# Patient Record
Sex: Male | Born: 1945
Health system: Southern US, Community
[De-identification: ages and names within clinical notes are randomized; demographics above are authoritative.]

## PROBLEM LIST (undated history)

## (undated) DIAGNOSIS — I6930 Unspecified sequelae of cerebral infarction: Secondary | ICD-10-CM

## (undated) DIAGNOSIS — Z794 Long term (current) use of insulin: Secondary | ICD-10-CM

## (undated) DIAGNOSIS — E7800 Pure hypercholesterolemia, unspecified: Secondary | ICD-10-CM

## (undated) DIAGNOSIS — I110 Hypertensive heart disease with heart failure: Secondary | ICD-10-CM

## (undated) DIAGNOSIS — E1165 Type 2 diabetes mellitus with hyperglycemia: Secondary | ICD-10-CM

## (undated) DIAGNOSIS — I2510 Atherosclerotic heart disease of native coronary artery without angina pectoris: Secondary | ICD-10-CM

## (undated) DIAGNOSIS — I6381 Other cerebral infarction due to occlusion or stenosis of small artery: Secondary | ICD-10-CM

## (undated) DIAGNOSIS — I5022 Chronic systolic (congestive) heart failure: Secondary | ICD-10-CM

## (undated) HISTORY — DX: Other cerebral infarction due to occlusion or stenosis of small artery: I63.81

## (undated) HISTORY — PX: CORONARY ARTERY BYPASS GRAFT: SHX141

## (undated) HISTORY — DX: Unspecified sequelae of cerebral infarction: I69.30

## (undated) HISTORY — DX: Long term (current) use of insulin: Z79.4

## (undated) HISTORY — DX: Hypertensive heart disease with heart failure: I11.0

## (undated) HISTORY — DX: Pure hypercholesterolemia, unspecified: E78.00

## (undated) HISTORY — DX: Chronic systolic (congestive) heart failure: I50.22

## (undated) HISTORY — DX: Type 2 diabetes mellitus with hyperglycemia: E11.65

---

## 1995-02-06 HISTORY — PX: BACK SURGERY: SHX140

## 1998-02-05 HISTORY — PX: NECK SURGERY: SHX720

## 2007-08-11 ENCOUNTER — Ambulatory Visit: Payer: Self-pay | Admitting: Cardiothoracic Surgery

## 2014-06-03 DIAGNOSIS — I351 Nonrheumatic aortic (valve) insufficiency: Secondary | ICD-10-CM | POA: Diagnosis not present

## 2014-06-03 DIAGNOSIS — I63411 Cerebral infarction due to embolism of right middle cerebral artery: Secondary | ICD-10-CM | POA: Diagnosis present

## 2014-06-03 DIAGNOSIS — I361 Nonrheumatic tricuspid (valve) insufficiency: Secondary | ICD-10-CM | POA: Diagnosis not present

## 2014-06-03 DIAGNOSIS — Z951 Presence of aortocoronary bypass graft: Secondary | ICD-10-CM | POA: Diagnosis not present

## 2014-06-03 DIAGNOSIS — I340 Nonrheumatic mitral (valve) insufficiency: Secondary | ICD-10-CM | POA: Diagnosis present

## 2014-06-03 DIAGNOSIS — S199XXA Unspecified injury of neck, initial encounter: Secondary | ICD-10-CM | POA: Diagnosis not present

## 2014-06-03 DIAGNOSIS — E1165 Type 2 diabetes mellitus with hyperglycemia: Secondary | ICD-10-CM | POA: Diagnosis present

## 2014-06-03 DIAGNOSIS — I6789 Other cerebrovascular disease: Secondary | ICD-10-CM | POA: Diagnosis not present

## 2014-06-03 DIAGNOSIS — I255 Ischemic cardiomyopathy: Secondary | ICD-10-CM | POA: Diagnosis present

## 2014-06-03 DIAGNOSIS — F17210 Nicotine dependence, cigarettes, uncomplicated: Secondary | ICD-10-CM | POA: Diagnosis present

## 2014-06-03 DIAGNOSIS — H53462 Homonymous bilateral field defects, left side: Secondary | ICD-10-CM | POA: Diagnosis present

## 2014-06-03 DIAGNOSIS — S0990XA Unspecified injury of head, initial encounter: Secondary | ICD-10-CM | POA: Diagnosis not present

## 2014-06-03 DIAGNOSIS — I252 Old myocardial infarction: Secondary | ICD-10-CM | POA: Diagnosis not present

## 2014-06-03 DIAGNOSIS — F17211 Nicotine dependence, cigarettes, in remission: Secondary | ICD-10-CM | POA: Diagnosis not present

## 2014-06-03 DIAGNOSIS — R29810 Facial weakness: Secondary | ICD-10-CM | POA: Diagnosis not present

## 2014-06-03 DIAGNOSIS — I638 Other cerebral infarction: Secondary | ICD-10-CM | POA: Diagnosis not present

## 2014-06-03 DIAGNOSIS — R4182 Altered mental status, unspecified: Secondary | ICD-10-CM | POA: Diagnosis not present

## 2014-06-03 DIAGNOSIS — G8194 Hemiplegia, unspecified affecting left nondominant side: Secondary | ICD-10-CM | POA: Diagnosis present

## 2014-06-03 DIAGNOSIS — R531 Weakness: Secondary | ICD-10-CM | POA: Diagnosis not present

## 2014-06-03 DIAGNOSIS — R4781 Slurred speech: Secondary | ICD-10-CM | POA: Diagnosis not present

## 2014-06-03 DIAGNOSIS — Z7982 Long term (current) use of aspirin: Secondary | ICD-10-CM | POA: Diagnosis not present

## 2014-06-03 DIAGNOSIS — I63431 Cerebral infarction due to embolism of right posterior cerebral artery: Secondary | ICD-10-CM | POA: Diagnosis not present

## 2014-06-03 DIAGNOSIS — I480 Paroxysmal atrial fibrillation: Secondary | ICD-10-CM | POA: Diagnosis present

## 2014-06-03 DIAGNOSIS — I639 Cerebral infarction, unspecified: Secondary | ICD-10-CM | POA: Diagnosis not present

## 2014-06-03 DIAGNOSIS — E785 Hyperlipidemia, unspecified: Secondary | ICD-10-CM | POA: Diagnosis not present

## 2014-06-03 DIAGNOSIS — I63311 Cerebral infarction due to thrombosis of right middle cerebral artery: Secondary | ICD-10-CM | POA: Diagnosis not present

## 2014-06-03 DIAGNOSIS — I6350 Cerebral infarction due to unspecified occlusion or stenosis of unspecified cerebral artery: Secondary | ICD-10-CM | POA: Diagnosis not present

## 2014-06-03 DIAGNOSIS — I2510 Atherosclerotic heart disease of native coronary artery without angina pectoris: Secondary | ICD-10-CM | POA: Diagnosis not present

## 2014-06-03 DIAGNOSIS — S60512A Abrasion of left hand, initial encounter: Secondary | ICD-10-CM | POA: Diagnosis not present

## 2014-06-03 DIAGNOSIS — I63312 Cerebral infarction due to thrombosis of left middle cerebral artery: Secondary | ICD-10-CM | POA: Diagnosis not present

## 2014-06-03 DIAGNOSIS — R471 Dysarthria and anarthria: Secondary | ICD-10-CM | POA: Diagnosis present

## 2014-06-06 DIAGNOSIS — I639 Cerebral infarction, unspecified: Secondary | ICD-10-CM | POA: Diagnosis not present

## 2014-06-06 DIAGNOSIS — I255 Ischemic cardiomyopathy: Secondary | ICD-10-CM | POA: Diagnosis not present

## 2014-06-07 DIAGNOSIS — Z7901 Long term (current) use of anticoagulants: Secondary | ICD-10-CM | POA: Diagnosis not present

## 2014-06-07 DIAGNOSIS — I639 Cerebral infarction, unspecified: Secondary | ICD-10-CM | POA: Diagnosis not present

## 2014-06-07 DIAGNOSIS — Z5181 Encounter for therapeutic drug level monitoring: Secondary | ICD-10-CM | POA: Diagnosis not present

## 2014-06-11 DIAGNOSIS — I10 Essential (primary) hypertension: Secondary | ICD-10-CM | POA: Diagnosis not present

## 2014-06-11 DIAGNOSIS — Z7901 Long term (current) use of anticoagulants: Secondary | ICD-10-CM | POA: Diagnosis not present

## 2014-06-11 DIAGNOSIS — Z5181 Encounter for therapeutic drug level monitoring: Secondary | ICD-10-CM | POA: Diagnosis not present

## 2014-06-11 DIAGNOSIS — I6340 Cerebral infarction due to embolism of unspecified cerebral artery: Secondary | ICD-10-CM | POA: Diagnosis not present

## 2014-06-11 DIAGNOSIS — I639 Cerebral infarction, unspecified: Secondary | ICD-10-CM | POA: Diagnosis not present

## 2014-06-11 DIAGNOSIS — R791 Abnormal coagulation profile: Secondary | ICD-10-CM | POA: Diagnosis not present

## 2014-06-11 DIAGNOSIS — E119 Type 2 diabetes mellitus without complications: Secondary | ICD-10-CM | POA: Diagnosis not present

## 2014-06-17 DIAGNOSIS — Z5181 Encounter for therapeutic drug level monitoring: Secondary | ICD-10-CM | POA: Diagnosis not present

## 2014-06-17 DIAGNOSIS — I639 Cerebral infarction, unspecified: Secondary | ICD-10-CM | POA: Diagnosis not present

## 2014-06-17 DIAGNOSIS — Z7901 Long term (current) use of anticoagulants: Secondary | ICD-10-CM | POA: Diagnosis not present

## 2014-06-23 DIAGNOSIS — E118 Type 2 diabetes mellitus with unspecified complications: Secondary | ICD-10-CM | POA: Diagnosis not present

## 2014-06-23 DIAGNOSIS — I119 Hypertensive heart disease without heart failure: Secondary | ICD-10-CM | POA: Diagnosis not present

## 2014-06-23 DIAGNOSIS — E780 Pure hypercholesterolemia: Secondary | ICD-10-CM | POA: Diagnosis not present

## 2014-06-23 DIAGNOSIS — I639 Cerebral infarction, unspecified: Secondary | ICD-10-CM | POA: Diagnosis not present

## 2014-06-24 DIAGNOSIS — R791 Abnormal coagulation profile: Secondary | ICD-10-CM | POA: Diagnosis not present

## 2014-06-24 DIAGNOSIS — Z7901 Long term (current) use of anticoagulants: Secondary | ICD-10-CM | POA: Diagnosis not present

## 2014-06-24 DIAGNOSIS — I639 Cerebral infarction, unspecified: Secondary | ICD-10-CM | POA: Diagnosis not present

## 2014-06-24 DIAGNOSIS — E119 Type 2 diabetes mellitus without complications: Secondary | ICD-10-CM | POA: Diagnosis not present

## 2014-06-24 DIAGNOSIS — Z5181 Encounter for therapeutic drug level monitoring: Secondary | ICD-10-CM | POA: Diagnosis not present

## 2014-06-25 DIAGNOSIS — R002 Palpitations: Secondary | ICD-10-CM | POA: Diagnosis not present

## 2014-06-26 DIAGNOSIS — R002 Palpitations: Secondary | ICD-10-CM | POA: Diagnosis not present

## 2014-06-26 DIAGNOSIS — I491 Atrial premature depolarization: Secondary | ICD-10-CM | POA: Diagnosis not present

## 2014-06-26 DIAGNOSIS — I493 Ventricular premature depolarization: Secondary | ICD-10-CM | POA: Diagnosis not present

## 2014-07-06 DIAGNOSIS — Z7901 Long term (current) use of anticoagulants: Secondary | ICD-10-CM | POA: Diagnosis not present

## 2014-07-12 DIAGNOSIS — I255 Ischemic cardiomyopathy: Secondary | ICD-10-CM | POA: Diagnosis not present

## 2014-07-12 DIAGNOSIS — I6310 Cerebral infarction due to embolism of unspecified precerebral artery: Secondary | ICD-10-CM | POA: Diagnosis not present

## 2014-07-12 DIAGNOSIS — Z7901 Long term (current) use of anticoagulants: Secondary | ICD-10-CM | POA: Diagnosis not present

## 2014-07-12 DIAGNOSIS — I2510 Atherosclerotic heart disease of native coronary artery without angina pectoris: Secondary | ICD-10-CM | POA: Diagnosis not present

## 2014-07-12 DIAGNOSIS — E780 Pure hypercholesterolemia: Secondary | ICD-10-CM | POA: Diagnosis not present

## 2014-07-12 DIAGNOSIS — E119 Type 2 diabetes mellitus without complications: Secondary | ICD-10-CM | POA: Diagnosis not present

## 2014-07-19 DIAGNOSIS — Z7901 Long term (current) use of anticoagulants: Secondary | ICD-10-CM | POA: Diagnosis not present

## 2014-07-23 DIAGNOSIS — I5022 Chronic systolic (congestive) heart failure: Secondary | ICD-10-CM | POA: Diagnosis not present

## 2014-07-23 DIAGNOSIS — E1165 Type 2 diabetes mellitus with hyperglycemia: Secondary | ICD-10-CM | POA: Diagnosis not present

## 2014-07-23 DIAGNOSIS — I6930 Unspecified sequelae of cerebral infarction: Secondary | ICD-10-CM | POA: Diagnosis not present

## 2014-07-23 DIAGNOSIS — Z794 Long term (current) use of insulin: Secondary | ICD-10-CM | POA: Diagnosis not present

## 2014-07-26 DIAGNOSIS — E780 Pure hypercholesterolemia: Secondary | ICD-10-CM | POA: Diagnosis not present

## 2014-07-26 DIAGNOSIS — Z79899 Other long term (current) drug therapy: Secondary | ICD-10-CM | POA: Diagnosis not present

## 2014-07-26 DIAGNOSIS — Z7901 Long term (current) use of anticoagulants: Secondary | ICD-10-CM | POA: Diagnosis not present

## 2014-07-30 DIAGNOSIS — Z7901 Long term (current) use of anticoagulants: Secondary | ICD-10-CM | POA: Diagnosis not present

## 2014-08-13 DIAGNOSIS — Z7901 Long term (current) use of anticoagulants: Secondary | ICD-10-CM | POA: Diagnosis not present

## 2014-08-20 DIAGNOSIS — Z794 Long term (current) use of insulin: Secondary | ICD-10-CM | POA: Diagnosis not present

## 2014-08-20 DIAGNOSIS — I6930 Unspecified sequelae of cerebral infarction: Secondary | ICD-10-CM | POA: Diagnosis not present

## 2014-08-20 DIAGNOSIS — E1165 Type 2 diabetes mellitus with hyperglycemia: Secondary | ICD-10-CM | POA: Diagnosis not present

## 2014-08-20 DIAGNOSIS — I5022 Chronic systolic (congestive) heart failure: Secondary | ICD-10-CM | POA: Diagnosis not present

## 2014-08-20 DIAGNOSIS — Z0000 Encounter for general adult medical examination without abnormal findings: Secondary | ICD-10-CM | POA: Diagnosis not present

## 2014-08-20 DIAGNOSIS — Z7901 Long term (current) use of anticoagulants: Secondary | ICD-10-CM | POA: Diagnosis not present

## 2014-08-20 DIAGNOSIS — E780 Pure hypercholesterolemia: Secondary | ICD-10-CM | POA: Diagnosis not present

## 2014-08-23 DIAGNOSIS — E119 Type 2 diabetes mellitus without complications: Secondary | ICD-10-CM | POA: Diagnosis not present

## 2014-08-23 DIAGNOSIS — E780 Pure hypercholesterolemia: Secondary | ICD-10-CM | POA: Diagnosis not present

## 2014-08-23 DIAGNOSIS — I10 Essential (primary) hypertension: Secondary | ICD-10-CM | POA: Diagnosis not present

## 2014-08-23 DIAGNOSIS — I639 Cerebral infarction, unspecified: Secondary | ICD-10-CM | POA: Diagnosis not present

## 2014-09-22 DIAGNOSIS — Z7901 Long term (current) use of anticoagulants: Secondary | ICD-10-CM | POA: Diagnosis not present

## 2014-09-23 DIAGNOSIS — E780 Pure hypercholesterolemia: Secondary | ICD-10-CM | POA: Diagnosis not present

## 2014-09-23 DIAGNOSIS — I639 Cerebral infarction, unspecified: Secondary | ICD-10-CM | POA: Diagnosis not present

## 2014-09-23 DIAGNOSIS — E119 Type 2 diabetes mellitus without complications: Secondary | ICD-10-CM | POA: Diagnosis not present

## 2014-09-23 DIAGNOSIS — I10 Essential (primary) hypertension: Secondary | ICD-10-CM | POA: Diagnosis not present

## 2014-10-06 DIAGNOSIS — Z7901 Long term (current) use of anticoagulants: Secondary | ICD-10-CM | POA: Diagnosis not present

## 2014-10-13 DIAGNOSIS — Z7901 Long term (current) use of anticoagulants: Secondary | ICD-10-CM | POA: Diagnosis not present

## 2014-11-12 DIAGNOSIS — Z7901 Long term (current) use of anticoagulants: Secondary | ICD-10-CM | POA: Diagnosis not present

## 2014-11-26 DIAGNOSIS — Z7901 Long term (current) use of anticoagulants: Secondary | ICD-10-CM | POA: Diagnosis not present

## 2014-12-20 DIAGNOSIS — Z7901 Long term (current) use of anticoagulants: Secondary | ICD-10-CM | POA: Diagnosis not present

## 2014-12-24 DIAGNOSIS — I509 Heart failure, unspecified: Secondary | ICD-10-CM | POA: Diagnosis not present

## 2014-12-24 DIAGNOSIS — I639 Cerebral infarction, unspecified: Secondary | ICD-10-CM | POA: Diagnosis not present

## 2014-12-24 DIAGNOSIS — E785 Hyperlipidemia, unspecified: Secondary | ICD-10-CM | POA: Diagnosis not present

## 2014-12-24 DIAGNOSIS — E119 Type 2 diabetes mellitus without complications: Secondary | ICD-10-CM | POA: Diagnosis not present

## 2015-01-03 DIAGNOSIS — Z7901 Long term (current) use of anticoagulants: Secondary | ICD-10-CM | POA: Diagnosis not present

## 2015-01-23 DIAGNOSIS — I509 Heart failure, unspecified: Secondary | ICD-10-CM | POA: Diagnosis not present

## 2015-01-23 DIAGNOSIS — E119 Type 2 diabetes mellitus without complications: Secondary | ICD-10-CM | POA: Diagnosis not present

## 2015-01-23 DIAGNOSIS — I639 Cerebral infarction, unspecified: Secondary | ICD-10-CM | POA: Diagnosis not present

## 2015-01-23 DIAGNOSIS — E785 Hyperlipidemia, unspecified: Secondary | ICD-10-CM | POA: Diagnosis not present

## 2015-02-04 DIAGNOSIS — Z7901 Long term (current) use of anticoagulants: Secondary | ICD-10-CM | POA: Diagnosis not present

## 2015-02-04 DIAGNOSIS — Z794 Long term (current) use of insulin: Secondary | ICD-10-CM | POA: Diagnosis not present

## 2015-02-04 DIAGNOSIS — E785 Hyperlipidemia, unspecified: Secondary | ICD-10-CM | POA: Diagnosis not present

## 2015-02-04 DIAGNOSIS — I5022 Chronic systolic (congestive) heart failure: Secondary | ICD-10-CM | POA: Diagnosis not present

## 2015-02-04 DIAGNOSIS — E1165 Type 2 diabetes mellitus with hyperglycemia: Secondary | ICD-10-CM | POA: Diagnosis not present

## 2015-07-20 DIAGNOSIS — Z7901 Long term (current) use of anticoagulants: Secondary | ICD-10-CM | POA: Diagnosis not present

## 2015-08-03 DIAGNOSIS — Z7901 Long term (current) use of anticoagulants: Secondary | ICD-10-CM | POA: Diagnosis not present

## 2015-08-10 DIAGNOSIS — Z7901 Long term (current) use of anticoagulants: Secondary | ICD-10-CM | POA: Diagnosis not present

## 2015-08-11 DIAGNOSIS — Z794 Long term (current) use of insulin: Secondary | ICD-10-CM | POA: Diagnosis not present

## 2015-08-11 DIAGNOSIS — I6930 Unspecified sequelae of cerebral infarction: Secondary | ICD-10-CM | POA: Diagnosis not present

## 2015-08-11 DIAGNOSIS — I5022 Chronic systolic (congestive) heart failure: Secondary | ICD-10-CM | POA: Diagnosis not present

## 2015-08-11 DIAGNOSIS — I110 Hypertensive heart disease with heart failure: Secondary | ICD-10-CM | POA: Diagnosis not present

## 2015-08-11 DIAGNOSIS — E1165 Type 2 diabetes mellitus with hyperglycemia: Secondary | ICD-10-CM | POA: Diagnosis not present

## 2015-09-07 DIAGNOSIS — Z7901 Long term (current) use of anticoagulants: Secondary | ICD-10-CM | POA: Diagnosis not present

## 2015-09-21 DIAGNOSIS — Z7901 Long term (current) use of anticoagulants: Secondary | ICD-10-CM | POA: Diagnosis not present

## 2015-09-28 DIAGNOSIS — Z7901 Long term (current) use of anticoagulants: Secondary | ICD-10-CM | POA: Diagnosis not present

## 2015-10-12 DIAGNOSIS — Z7901 Long term (current) use of anticoagulants: Secondary | ICD-10-CM | POA: Diagnosis not present

## 2015-10-19 DIAGNOSIS — Z7901 Long term (current) use of anticoagulants: Secondary | ICD-10-CM | POA: Diagnosis not present

## 2015-11-02 DIAGNOSIS — Z7901 Long term (current) use of anticoagulants: Secondary | ICD-10-CM | POA: Diagnosis not present

## 2015-11-16 DIAGNOSIS — Z7901 Long term (current) use of anticoagulants: Secondary | ICD-10-CM | POA: Diagnosis not present

## 2015-11-30 DIAGNOSIS — Z7901 Long term (current) use of anticoagulants: Secondary | ICD-10-CM | POA: Diagnosis not present

## 2015-12-28 DIAGNOSIS — Z7901 Long term (current) use of anticoagulants: Secondary | ICD-10-CM | POA: Diagnosis not present

## 2016-01-18 DIAGNOSIS — Z7901 Long term (current) use of anticoagulants: Secondary | ICD-10-CM | POA: Diagnosis not present

## 2016-02-01 DIAGNOSIS — Z7901 Long term (current) use of anticoagulants: Secondary | ICD-10-CM | POA: Diagnosis not present

## 2016-02-15 DIAGNOSIS — Z7901 Long term (current) use of anticoagulants: Secondary | ICD-10-CM | POA: Diagnosis not present

## 2016-03-05 DIAGNOSIS — N419 Inflammatory disease of prostate, unspecified: Secondary | ICD-10-CM | POA: Diagnosis not present

## 2016-03-05 DIAGNOSIS — R319 Hematuria, unspecified: Secondary | ICD-10-CM | POA: Diagnosis not present

## 2016-03-05 DIAGNOSIS — N3001 Acute cystitis with hematuria: Secondary | ICD-10-CM | POA: Diagnosis not present

## 2016-03-07 DIAGNOSIS — Z7901 Long term (current) use of anticoagulants: Secondary | ICD-10-CM | POA: Diagnosis not present

## 2016-03-14 DIAGNOSIS — Z7901 Long term (current) use of anticoagulants: Secondary | ICD-10-CM | POA: Diagnosis not present

## 2016-03-21 DIAGNOSIS — Z794 Long term (current) use of insulin: Secondary | ICD-10-CM | POA: Diagnosis not present

## 2016-03-21 DIAGNOSIS — N5089 Other specified disorders of the male genital organs: Secondary | ICD-10-CM | POA: Diagnosis not present

## 2016-03-21 DIAGNOSIS — R310 Gross hematuria: Secondary | ICD-10-CM | POA: Diagnosis not present

## 2016-03-21 DIAGNOSIS — E1165 Type 2 diabetes mellitus with hyperglycemia: Secondary | ICD-10-CM | POA: Diagnosis not present

## 2016-03-21 DIAGNOSIS — Z125 Encounter for screening for malignant neoplasm of prostate: Secondary | ICD-10-CM | POA: Diagnosis not present

## 2016-03-21 DIAGNOSIS — R0602 Shortness of breath: Secondary | ICD-10-CM | POA: Diagnosis not present

## 2016-03-26 DIAGNOSIS — N433 Hydrocele, unspecified: Secondary | ICD-10-CM | POA: Diagnosis not present

## 2016-03-26 DIAGNOSIS — N5089 Other specified disorders of the male genital organs: Secondary | ICD-10-CM | POA: Diagnosis not present

## 2016-04-03 DIAGNOSIS — R338 Other retention of urine: Secondary | ICD-10-CM | POA: Diagnosis not present

## 2016-04-03 DIAGNOSIS — E291 Testicular hypofunction: Secondary | ICD-10-CM | POA: Diagnosis not present

## 2016-04-03 DIAGNOSIS — N401 Enlarged prostate with lower urinary tract symptoms: Secondary | ICD-10-CM | POA: Diagnosis not present

## 2016-04-03 DIAGNOSIS — N433 Hydrocele, unspecified: Secondary | ICD-10-CM | POA: Diagnosis not present

## 2016-04-05 DIAGNOSIS — J90 Pleural effusion, not elsewhere classified: Secondary | ICD-10-CM | POA: Diagnosis not present

## 2016-04-05 DIAGNOSIS — R109 Unspecified abdominal pain: Secondary | ICD-10-CM | POA: Diagnosis not present

## 2016-04-05 DIAGNOSIS — N3020 Other chronic cystitis without hematuria: Secondary | ICD-10-CM | POA: Diagnosis not present

## 2016-04-05 DIAGNOSIS — R188 Other ascites: Secondary | ICD-10-CM | POA: Diagnosis not present

## 2016-04-05 DIAGNOSIS — N318 Other neuromuscular dysfunction of bladder: Secondary | ICD-10-CM | POA: Diagnosis not present

## 2016-04-05 DIAGNOSIS — R3129 Other microscopic hematuria: Secondary | ICD-10-CM | POA: Diagnosis not present

## 2016-04-05 DIAGNOSIS — R311 Benign essential microscopic hematuria: Secondary | ICD-10-CM | POA: Diagnosis not present

## 2016-04-05 DIAGNOSIS — I517 Cardiomegaly: Secondary | ICD-10-CM | POA: Diagnosis not present

## 2016-04-05 DIAGNOSIS — I700 Atherosclerosis of aorta: Secondary | ICD-10-CM | POA: Diagnosis not present

## 2016-04-09 DIAGNOSIS — N318 Other neuromuscular dysfunction of bladder: Secondary | ICD-10-CM | POA: Diagnosis not present

## 2016-04-09 DIAGNOSIS — N401 Enlarged prostate with lower urinary tract symptoms: Secondary | ICD-10-CM | POA: Diagnosis not present

## 2016-04-09 DIAGNOSIS — R351 Nocturia: Secondary | ICD-10-CM | POA: Diagnosis not present

## 2016-04-10 DIAGNOSIS — J90 Pleural effusion, not elsewhere classified: Secondary | ICD-10-CM | POA: Diagnosis not present

## 2016-04-10 DIAGNOSIS — R188 Other ascites: Secondary | ICD-10-CM | POA: Diagnosis not present

## 2016-04-10 DIAGNOSIS — I700 Atherosclerosis of aorta: Secondary | ICD-10-CM | POA: Diagnosis not present

## 2016-04-11 ENCOUNTER — Ambulatory Visit (INDEPENDENT_AMBULATORY_CARE_PROVIDER_SITE_OTHER): Payer: Medicare Other | Admitting: Pulmonary Disease

## 2016-04-11 ENCOUNTER — Encounter: Payer: Self-pay | Admitting: Pulmonary Disease

## 2016-04-11 ENCOUNTER — Ambulatory Visit (INDEPENDENT_AMBULATORY_CARE_PROVIDER_SITE_OTHER)
Admission: RE | Admit: 2016-04-11 | Discharge: 2016-04-11 | Disposition: A | Discharge status: Home / Self Care | Payer: Medicare Other | Source: Ambulatory Visit | Attending: Pulmonary Disease | Admitting: Pulmonary Disease

## 2016-04-11 ENCOUNTER — Encounter (INDEPENDENT_AMBULATORY_CARE_PROVIDER_SITE_OTHER): Payer: Self-pay

## 2016-04-11 DIAGNOSIS — I517 Cardiomegaly: Secondary | ICD-10-CM | POA: Diagnosis not present

## 2016-04-11 DIAGNOSIS — R911 Solitary pulmonary nodule: Secondary | ICD-10-CM

## 2016-04-11 DIAGNOSIS — J90 Pleural effusion, not elsewhere classified: Secondary | ICD-10-CM | POA: Diagnosis not present

## 2016-04-11 DIAGNOSIS — I255 Ischemic cardiomyopathy: Secondary | ICD-10-CM

## 2016-04-11 DIAGNOSIS — I350 Nonrheumatic aortic (valve) stenosis: Secondary | ICD-10-CM | POA: Diagnosis not present

## 2016-04-11 DIAGNOSIS — I351 Nonrheumatic aortic (valve) insufficiency: Secondary | ICD-10-CM | POA: Diagnosis not present

## 2016-04-11 DIAGNOSIS — I081 Rheumatic disorders of both mitral and tricuspid valves: Secondary | ICD-10-CM | POA: Diagnosis not present

## 2016-04-11 NOTE — Assessment & Plan Note (Addendum)
Three-month follow-up scan scheduled for June 2018 - due to his heavy history of smoking will need short-term follow-up This could be related to fluid -I reviewed the images it is difficult to differentiate fluid density from true nodule

## 2016-04-11 NOTE — Assessment & Plan Note (Signed)
Related to congestive heart failure Continue diuresis Chest x-ray today

## 2016-04-11 NOTE — Assessment & Plan Note (Signed)
He does have pansystolic murmur of mitral regurgitation. He had an echocardiogram done today that was ordered by his PCP. He certainly needs follow-up with cardiologist and perhaps referral to an advanced heart failure team. He needs continued diuresis as much as his renal function will tolerate

## 2016-04-11 NOTE — Patient Instructions (Signed)
Chest x-ray today Follow-up CT scan no contrast in 3 months  Stay on Lasix, may have to increase dose if swelling does not go down

## 2016-04-11 NOTE — Progress Notes (Addendum)
Subjective:    Patient ID: Johnny Durham, male    DOB: Nov 27, 1945, 71 y.o.   MRN: [id number]  HPI  71 year old heavy ex-smoker with ischemic cardiomyopathy presents for evaluation of pleural effusions and finding of nodule on his imaging studies. Echo in 2016 and showed an EF of 35%, he is known to have ischemic cardiomyopathy since 2009 after a myocardial infarction. He is a diabetic for more than 15 years and has proteinuria. He developed increased body swelling starting from his scrotum in January which was progressive he saw a urologist in [place]. His weight had increased from 157-185 pounds. He was finally placed on Lasix which has helped decrease some of his abdominal swelling but swelling in his feet persists.   CT abdomen 03/2016 showed diffuse body wall edema with small volume of ascites and bilateral pleural effusions. CT chest with contrast 04/10/16 did not show any evidence of pulmonary embolism, confirmed bilateral effusions right more than left and showed a 11 mm nodule in the left lower lobe. I have reviewed this images and it is difficult to differentiate from fluid  He states that his dyspnea is considerably improved. He denies wheezing, coughing or chest pains. He denies frequent chest colds.  Spirometry today shows moderate to severe restriction, surprisingly no airway obstruction. He is a retired Administrator and smoked about 2-3 packs per day at his maximum and quit in 2016 when he was smoking about 1 pack per day- more than 50 pack years. He admits to continuing to vape now  Reviewed labs sent in from PCP including CBC and see med which appears normal, albumin was 4.3 and urinalysis showed severe proteinuria  Past Medical History:  Diagnosis Date  . Chronic systolic congestive heart failure, NYHA class 2 ([place])   . Hypercholesteremia   . Hypertensive heart disease with CHF ([place])   . Lacunar infarct, acute ([place])   . Sequela of cerebrovascular accident   . Uncontrolled  type 2 diabetes mellitus with hyperglycemia, with long-term current use of insulin [facility])      Past Surgical History:  Procedure Laterality Date  . BACK SURGERY  1997  . CORONARY ARTERY BYPASS GRAFT    . NECK SURGERY  2000     No Known Allergies   Social History   Social History  . Marital status: Married    Spouse name: N/A  . Number of children: N/A  . Years of education: N/A   Occupational History  . Not on file.   Social History Main Topics  . Smoking status: Former Research scientist (life sciences)  . Smokeless tobacco: Never Used  . Alcohol use No  . Drug use: No  . Sexual activity: Not on file   Other Topics Concern  . Not on file   Social History Narrative  . No narrative on file     Family History  Problem Relation Age of Onset  . Stomach cancer Mother   . Diabetes Mother   . Hypertension Mother   . Stomach cancer Father   . CVA Sister   . Colon polyps Brother   . Diabetes Brother   . Hypertension Brother   . Heart attack Paternal Grandmother   . Hypercholesterolemia Paternal Grandmother   . Heart attack Paternal Grandfather   . Hypercholesterolemia Paternal Grandfather   . Heart attack Paternal Uncle   . Hypercholesterolemia Paternal Uncle   . Diabetes Paternal Aunt     Review of Systems   Positive for shortness of breath with activity,  swelling all over the body is patient in the feet and increase in weight by about 20 pounds  Constitutional: negative for anorexia, fevers and sweats  Eyes: negative for irritation, redness and visual disturbance  Ears, nose, mouth, throat, and face: negative for earaches, epistaxis, nasal congestion and sore throat  Respiratory: negative for cough, sputum and wheezing  Cardiovascular: negative for chest pain, dyspnea,  orthopnea, palpitations and syncope  Gastrointestinal: negative for abdominal pain, constipation, diarrhea, melena, nausea and vomiting  Genitourinary:negative for dysuria, frequency and hematuria    Hematologic/lymphatic: negative for bleeding, easy bruising and lymphadenopathy  Musculoskeletal:negative for arthralgias, muscle weakness and stiff joints  Neurological: negative for coordination problems, gait problems, headaches and weakness  Endocrine: negative for diabetic symptoms including polydipsia, polyuria and weight loss     Objective:   Physical Exam  Gen. Pleasant, well-nourished, in no distress, normal affect ENT - no lesions, no post nasal drip Neck: No JVD, no thyromegaly, no carotid bruits Lungs: no use of accessory muscles, dullness to percussion Right base, Decreased breath sounds bilateral without rales or rhonchi  Cardiovascular: Rhythm regular, heart sounds  normal, pansystolic murmur at apex and left sternal border, no gallops, 2+ peripheral edema Abdomen: soft and non-tender, no hepatosplenomegaly, BS normal. Musculoskeletal: No deformities, no cyanosis or clubbing Neuro:  alert, non focal       Assessment & Plan:

## 2016-04-20 DIAGNOSIS — Z0000 Encounter for general adult medical examination without abnormal findings: Secondary | ICD-10-CM | POA: Diagnosis not present

## 2016-04-20 DIAGNOSIS — Z794 Long term (current) use of insulin: Secondary | ICD-10-CM | POA: Diagnosis not present

## 2016-04-20 DIAGNOSIS — E1165 Type 2 diabetes mellitus with hyperglycemia: Secondary | ICD-10-CM | POA: Diagnosis not present

## 2016-04-20 DIAGNOSIS — I482 Chronic atrial fibrillation: Secondary | ICD-10-CM | POA: Diagnosis not present

## 2016-04-20 DIAGNOSIS — Z7901 Long term (current) use of anticoagulants: Secondary | ICD-10-CM | POA: Diagnosis not present

## 2016-04-20 DIAGNOSIS — I110 Hypertensive heart disease with heart failure: Secondary | ICD-10-CM | POA: Diagnosis not present

## 2016-04-20 DIAGNOSIS — I5022 Chronic systolic (congestive) heart failure: Secondary | ICD-10-CM | POA: Diagnosis not present

## 2016-04-20 DIAGNOSIS — R0602 Shortness of breath: Secondary | ICD-10-CM | POA: Diagnosis not present

## 2016-04-27 DIAGNOSIS — Z5181 Encounter for therapeutic drug level monitoring: Secondary | ICD-10-CM | POA: Diagnosis not present

## 2016-04-27 DIAGNOSIS — Z7901 Long term (current) use of anticoagulants: Secondary | ICD-10-CM | POA: Diagnosis not present

## 2016-04-30 DIAGNOSIS — I200 Unstable angina: Secondary | ICD-10-CM | POA: Diagnosis not present

## 2016-04-30 DIAGNOSIS — I10 Essential (primary) hypertension: Secondary | ICD-10-CM | POA: Diagnosis not present

## 2016-04-30 DIAGNOSIS — I255 Ischemic cardiomyopathy: Secondary | ICD-10-CM | POA: Diagnosis not present

## 2016-04-30 DIAGNOSIS — E782 Mixed hyperlipidemia: Secondary | ICD-10-CM | POA: Diagnosis not present

## 2016-04-30 DIAGNOSIS — I2510 Atherosclerotic heart disease of native coronary artery without angina pectoris: Secondary | ICD-10-CM | POA: Diagnosis not present

## 2016-05-01 DIAGNOSIS — I2510 Atherosclerotic heart disease of native coronary artery without angina pectoris: Secondary | ICD-10-CM | POA: Diagnosis not present

## 2016-05-01 DIAGNOSIS — E782 Mixed hyperlipidemia: Secondary | ICD-10-CM | POA: Diagnosis not present

## 2016-05-01 DIAGNOSIS — I10 Essential (primary) hypertension: Secondary | ICD-10-CM | POA: Diagnosis not present

## 2016-05-01 DIAGNOSIS — I255 Ischemic cardiomyopathy: Secondary | ICD-10-CM | POA: Diagnosis not present

## 2016-05-03 DIAGNOSIS — Z7901 Long term (current) use of anticoagulants: Secondary | ICD-10-CM | POA: Diagnosis not present

## 2016-05-03 DIAGNOSIS — Z5181 Encounter for therapeutic drug level monitoring: Secondary | ICD-10-CM | POA: Diagnosis not present

## 2016-05-10 DIAGNOSIS — Z7901 Long term (current) use of anticoagulants: Secondary | ICD-10-CM | POA: Diagnosis not present

## 2016-05-10 DIAGNOSIS — Z5181 Encounter for therapeutic drug level monitoring: Secondary | ICD-10-CM | POA: Diagnosis not present

## 2016-05-24 DIAGNOSIS — Z7901 Long term (current) use of anticoagulants: Secondary | ICD-10-CM | POA: Diagnosis not present

## 2016-05-24 DIAGNOSIS — Z5181 Encounter for therapeutic drug level monitoring: Secondary | ICD-10-CM | POA: Diagnosis not present

## 2016-06-05 DIAGNOSIS — I2510 Atherosclerotic heart disease of native coronary artery without angina pectoris: Secondary | ICD-10-CM | POA: Diagnosis not present

## 2016-06-05 DIAGNOSIS — I10 Essential (primary) hypertension: Secondary | ICD-10-CM | POA: Diagnosis not present

## 2016-06-05 DIAGNOSIS — E782 Mixed hyperlipidemia: Secondary | ICD-10-CM | POA: Diagnosis not present

## 2016-06-19 ENCOUNTER — Telehealth: Payer: Self-pay

## 2016-06-19 DIAGNOSIS — E782 Mixed hyperlipidemia: Secondary | ICD-10-CM | POA: Diagnosis not present

## 2016-06-19 DIAGNOSIS — R911 Solitary pulmonary nodule: Secondary | ICD-10-CM

## 2016-06-19 DIAGNOSIS — I2510 Atherosclerotic heart disease of native coronary artery without angina pectoris: Secondary | ICD-10-CM | POA: Diagnosis not present

## 2016-06-19 DIAGNOSIS — I10 Essential (primary) hypertension: Secondary | ICD-10-CM | POA: Diagnosis not present

## 2016-06-19 DIAGNOSIS — I255 Ischemic cardiomyopathy: Secondary | ICD-10-CM | POA: Diagnosis not present

## 2016-06-19 NOTE — Telephone Encounter (Signed)
Pt wife return call to [place].Hillery Hunter

## 2016-06-19 NOTE — Telephone Encounter (Signed)
Called wife and left a message for her to call back to discuss CT per RA.

## 2016-06-19 NOTE — Telephone Encounter (Signed)
-----   Message from Rigoberto Noel, MD sent at 06/19/2016 10:52 AM EDT ----- Regarding: RE: follow up ov & June CT Verdene Creson,  Pl explain to him And documented phone noteThat   nodule was found on his prior CT scan and due to suspicion of cancer 3-6 month follow-up CT scan is advised   RA ----- Message ----- From: Ilona Sorrel Sent: 06/19/2016  10:27 AM To: Rigoberto Noel, MD, Valerie Salts, CMA Subject: follow up ov & June CT                         FYI - Order was put in on 04/11/16 for pt to have CT in June.  Follow up ov was sched for 6/8.  I made pt appt on 6/7 for CT.  I had left vm for pt to call me back for CT appt info & wife called this morning & stated he is doing much better, no fluid or cough.  She wanted to cancel ov & CT.  Stated they would call back if he has the need.     Judeen Hammans

## 2016-06-19 NOTE — Telephone Encounter (Signed)
Spoke with patient's wife regarding CT scan and OV. Pt's wife agreed that he needs the CT scan. Order will be placed again as it was already ordered and scheduled on 6/7.

## 2016-06-22 DIAGNOSIS — Z5181 Encounter for therapeutic drug level monitoring: Secondary | ICD-10-CM | POA: Diagnosis not present

## 2016-06-22 DIAGNOSIS — Z7901 Long term (current) use of anticoagulants: Secondary | ICD-10-CM | POA: Diagnosis not present

## 2016-06-22 DIAGNOSIS — E875 Hyperkalemia: Secondary | ICD-10-CM | POA: Diagnosis not present

## 2016-07-12 ENCOUNTER — Ambulatory Visit (INDEPENDENT_AMBULATORY_CARE_PROVIDER_SITE_OTHER)
Admission: RE | Admit: 2016-07-12 | Discharge: 2016-07-12 | Disposition: A | Discharge status: Home / Self Care | Payer: Medicare Other | Source: Ambulatory Visit | Attending: Pulmonary Disease | Admitting: Pulmonary Disease

## 2016-07-12 ENCOUNTER — Inpatient Hospital Stay: Admission: RE | Admit: 2016-07-12 | Payer: Medicare Other | Source: Ambulatory Visit

## 2016-07-12 DIAGNOSIS — R911 Solitary pulmonary nodule: Secondary | ICD-10-CM | POA: Diagnosis not present

## 2016-07-12 DIAGNOSIS — J90 Pleural effusion, not elsewhere classified: Secondary | ICD-10-CM | POA: Diagnosis not present

## 2016-07-12 DIAGNOSIS — R918 Other nonspecific abnormal finding of lung field: Secondary | ICD-10-CM | POA: Diagnosis not present

## 2016-07-13 ENCOUNTER — Ambulatory Visit: Payer: Medicare Other | Admitting: Pulmonary Disease

## 2016-07-13 ENCOUNTER — Telehealth: Payer: Self-pay | Admitting: Pulmonary Disease

## 2016-07-13 DIAGNOSIS — R911 Solitary pulmonary nodule: Secondary | ICD-10-CM

## 2016-07-13 NOTE — Telephone Encounter (Signed)
He still has fluid around right lung but this is decreased in size Nodule has gone away-was likely a combination of fluid and atelectasis  He still has lymphadenopathy but this is stable compared to prior CT from 04/2016  He will need to follow-up and need another follow-up scan in 6 months to assess lymphadenopathy

## 2016-07-13 NOTE — Telephone Encounter (Signed)
Called and spoke with the pt  He states needing his CT Chest results over the phone  He does not want to come in due to the far drive to get here, and he already cancelled his appt with TP for 6/11  Please advise, thanks!

## 2016-07-13 NOTE — Telephone Encounter (Signed)
Spoke with pt, aware of results/recs.  Order placed for follow up ct and recall placed for rov.  Nothing further needed.

## 2016-07-16 ENCOUNTER — Ambulatory Visit: Payer: Medicare Other | Admitting: Adult Health

## 2017-01-02 DIAGNOSIS — Z79899 Other long term (current) drug therapy: Secondary | ICD-10-CM | POA: Diagnosis not present

## 2017-01-02 DIAGNOSIS — Z7901 Long term (current) use of anticoagulants: Secondary | ICD-10-CM | POA: Diagnosis not present

## 2017-01-02 DIAGNOSIS — Z5181 Encounter for therapeutic drug level monitoring: Secondary | ICD-10-CM | POA: Diagnosis not present

## 2017-01-02 DIAGNOSIS — Z6824 Body mass index (BMI) 24.0-24.9, adult: Secondary | ICD-10-CM | POA: Diagnosis not present

## 2017-01-02 DIAGNOSIS — R600 Localized edema: Secondary | ICD-10-CM | POA: Diagnosis not present

## 2017-01-04 DIAGNOSIS — Z5181 Encounter for therapeutic drug level monitoring: Secondary | ICD-10-CM | POA: Diagnosis not present

## 2017-01-04 DIAGNOSIS — Z7901 Long term (current) use of anticoagulants: Secondary | ICD-10-CM | POA: Diagnosis not present

## 2017-01-08 DIAGNOSIS — Z5181 Encounter for therapeutic drug level monitoring: Secondary | ICD-10-CM | POA: Diagnosis not present

## 2017-01-08 DIAGNOSIS — I5022 Chronic systolic (congestive) heart failure: Secondary | ICD-10-CM | POA: Diagnosis not present

## 2017-01-08 DIAGNOSIS — Z7901 Long term (current) use of anticoagulants: Secondary | ICD-10-CM | POA: Diagnosis not present

## 2017-01-11 ENCOUNTER — Telehealth: Payer: Self-pay | Admitting: Pulmonary Disease

## 2017-01-11 NOTE — Telephone Encounter (Signed)
ok 

## 2017-01-11 NOTE — Telephone Encounter (Signed)
FYI for RA that the pt cancelled his CT scan appt.

## 2017-01-14 ENCOUNTER — Inpatient Hospital Stay: Admission: RE | Admit: 2017-01-14 | Payer: Medicare Other | Source: Ambulatory Visit

## 2017-01-15 DIAGNOSIS — I5022 Chronic systolic (congestive) heart failure: Secondary | ICD-10-CM | POA: Diagnosis not present

## 2017-01-15 DIAGNOSIS — Z5181 Encounter for therapeutic drug level monitoring: Secondary | ICD-10-CM | POA: Diagnosis not present

## 2017-01-15 DIAGNOSIS — Z7901 Long term (current) use of anticoagulants: Secondary | ICD-10-CM | POA: Diagnosis not present

## 2017-01-22 DIAGNOSIS — Z5181 Encounter for therapeutic drug level monitoring: Secondary | ICD-10-CM | POA: Diagnosis not present

## 2017-01-22 DIAGNOSIS — Z7901 Long term (current) use of anticoagulants: Secondary | ICD-10-CM | POA: Diagnosis not present

## 2017-01-31 DIAGNOSIS — Z951 Presence of aortocoronary bypass graft: Secondary | ICD-10-CM | POA: Diagnosis not present

## 2017-01-31 DIAGNOSIS — Z7901 Long term (current) use of anticoagulants: Secondary | ICD-10-CM | POA: Diagnosis not present

## 2017-01-31 DIAGNOSIS — I5020 Unspecified systolic (congestive) heart failure: Secondary | ICD-10-CM | POA: Diagnosis not present

## 2017-01-31 DIAGNOSIS — I110 Hypertensive heart disease with heart failure: Secondary | ICD-10-CM | POA: Diagnosis not present

## 2017-01-31 DIAGNOSIS — I350 Nonrheumatic aortic (valve) stenosis: Secondary | ICD-10-CM | POA: Diagnosis not present

## 2017-01-31 DIAGNOSIS — R0602 Shortness of breath: Secondary | ICD-10-CM | POA: Diagnosis not present

## 2017-01-31 DIAGNOSIS — I2510 Atherosclerotic heart disease of native coronary artery without angina pectoris: Secondary | ICD-10-CM | POA: Diagnosis not present

## 2017-01-31 DIAGNOSIS — I255 Ischemic cardiomyopathy: Secondary | ICD-10-CM | POA: Diagnosis not present

## 2017-01-31 DIAGNOSIS — R609 Edema, unspecified: Secondary | ICD-10-CM | POA: Diagnosis not present

## 2017-02-02 DIAGNOSIS — S7002XA Contusion of left hip, initial encounter: Secondary | ICD-10-CM | POA: Diagnosis not present

## 2017-02-02 DIAGNOSIS — S20219A Contusion of unspecified front wall of thorax, initial encounter: Secondary | ICD-10-CM | POA: Diagnosis not present

## 2017-02-07 DIAGNOSIS — Z5181 Encounter for therapeutic drug level monitoring: Secondary | ICD-10-CM | POA: Diagnosis not present

## 2017-02-07 DIAGNOSIS — Z7901 Long term (current) use of anticoagulants: Secondary | ICD-10-CM | POA: Diagnosis not present

## 2017-02-12 DIAGNOSIS — Z5181 Encounter for therapeutic drug level monitoring: Secondary | ICD-10-CM | POA: Diagnosis not present

## 2017-02-12 DIAGNOSIS — Z7901 Long term (current) use of anticoagulants: Secondary | ICD-10-CM | POA: Diagnosis not present

## 2017-03-01 DIAGNOSIS — Z5181 Encounter for therapeutic drug level monitoring: Secondary | ICD-10-CM | POA: Diagnosis not present

## 2017-03-01 DIAGNOSIS — Z7901 Long term (current) use of anticoagulants: Secondary | ICD-10-CM | POA: Diagnosis not present

## 2017-03-07 DIAGNOSIS — I2510 Atherosclerotic heart disease of native coronary artery without angina pectoris: Secondary | ICD-10-CM | POA: Diagnosis not present

## 2017-03-08 DIAGNOSIS — Z5181 Encounter for therapeutic drug level monitoring: Secondary | ICD-10-CM | POA: Diagnosis not present

## 2017-03-08 DIAGNOSIS — Z7901 Long term (current) use of anticoagulants: Secondary | ICD-10-CM | POA: Diagnosis not present

## 2017-03-13 DIAGNOSIS — E1121 Type 2 diabetes mellitus with diabetic nephropathy: Secondary | ICD-10-CM | POA: Diagnosis not present

## 2017-03-13 DIAGNOSIS — Z6824 Body mass index (BMI) 24.0-24.9, adult: Secondary | ICD-10-CM | POA: Diagnosis not present

## 2017-03-13 DIAGNOSIS — R945 Abnormal results of liver function studies: Secondary | ICD-10-CM | POA: Diagnosis not present

## 2017-03-13 DIAGNOSIS — R188 Other ascites: Secondary | ICD-10-CM | POA: Diagnosis not present

## 2017-03-13 DIAGNOSIS — Z7189 Other specified counseling: Secondary | ICD-10-CM | POA: Diagnosis not present

## 2017-03-14 DIAGNOSIS — R1907 Generalized intra-abdominal and pelvic swelling, mass and lump: Secondary | ICD-10-CM | POA: Diagnosis not present

## 2017-03-18 DIAGNOSIS — R188 Other ascites: Secondary | ICD-10-CM | POA: Diagnosis not present

## 2017-03-18 DIAGNOSIS — J90 Pleural effusion, not elsewhere classified: Secondary | ICD-10-CM | POA: Diagnosis not present

## 2017-03-18 DIAGNOSIS — K4020 Bilateral inguinal hernia, without obstruction or gangrene, not specified as recurrent: Secondary | ICD-10-CM | POA: Diagnosis not present

## 2017-03-18 DIAGNOSIS — K4090 Unilateral inguinal hernia, without obstruction or gangrene, not specified as recurrent: Secondary | ICD-10-CM | POA: Diagnosis not present

## 2017-03-18 DIAGNOSIS — R1907 Generalized intra-abdominal and pelvic swelling, mass and lump: Secondary | ICD-10-CM | POA: Diagnosis not present

## 2017-03-18 DIAGNOSIS — Z981 Arthrodesis status: Secondary | ICD-10-CM | POA: Diagnosis not present

## 2017-03-19 DIAGNOSIS — I2510 Atherosclerotic heart disease of native coronary artery without angina pectoris: Secondary | ICD-10-CM | POA: Diagnosis not present

## 2017-03-19 DIAGNOSIS — R609 Edema, unspecified: Secondary | ICD-10-CM | POA: Diagnosis not present

## 2017-03-19 DIAGNOSIS — I10 Essential (primary) hypertension: Secondary | ICD-10-CM | POA: Diagnosis not present

## 2017-03-19 DIAGNOSIS — Z951 Presence of aortocoronary bypass graft: Secondary | ICD-10-CM | POA: Diagnosis not present

## 2017-03-19 DIAGNOSIS — R601 Generalized edema: Secondary | ICD-10-CM | POA: Diagnosis not present

## 2017-03-19 DIAGNOSIS — E782 Mixed hyperlipidemia: Secondary | ICD-10-CM | POA: Diagnosis not present

## 2017-03-19 DIAGNOSIS — I482 Chronic atrial fibrillation: Secondary | ICD-10-CM | POA: Diagnosis not present

## 2017-03-19 DIAGNOSIS — I350 Nonrheumatic aortic (valve) stenosis: Secondary | ICD-10-CM | POA: Diagnosis not present

## 2017-03-19 DIAGNOSIS — R0602 Shortness of breath: Secondary | ICD-10-CM | POA: Diagnosis not present

## 2017-03-19 DIAGNOSIS — I714 Abdominal aortic aneurysm, without rupture: Secondary | ICD-10-CM | POA: Diagnosis not present

## 2017-03-19 DIAGNOSIS — I255 Ischemic cardiomyopathy: Secondary | ICD-10-CM | POA: Diagnosis not present

## 2017-03-29 DIAGNOSIS — I10 Essential (primary) hypertension: Secondary | ICD-10-CM | POA: Diagnosis not present

## 2017-03-29 DIAGNOSIS — R0602 Shortness of breath: Secondary | ICD-10-CM | POA: Diagnosis not present

## 2017-03-29 DIAGNOSIS — I2510 Atherosclerotic heart disease of native coronary artery without angina pectoris: Secondary | ICD-10-CM | POA: Diagnosis not present

## 2017-03-29 DIAGNOSIS — I350 Nonrheumatic aortic (valve) stenosis: Secondary | ICD-10-CM | POA: Diagnosis not present

## 2017-03-29 DIAGNOSIS — Z5181 Encounter for therapeutic drug level monitoring: Secondary | ICD-10-CM | POA: Diagnosis not present

## 2017-03-29 DIAGNOSIS — I714 Abdominal aortic aneurysm, without rupture: Secondary | ICD-10-CM | POA: Diagnosis not present

## 2017-03-29 DIAGNOSIS — I255 Ischemic cardiomyopathy: Secondary | ICD-10-CM | POA: Diagnosis not present

## 2017-03-29 DIAGNOSIS — R609 Edema, unspecified: Secondary | ICD-10-CM | POA: Diagnosis not present

## 2017-03-29 DIAGNOSIS — E782 Mixed hyperlipidemia: Secondary | ICD-10-CM | POA: Diagnosis not present

## 2017-03-29 DIAGNOSIS — Z7901 Long term (current) use of anticoagulants: Secondary | ICD-10-CM | POA: Diagnosis not present

## 2017-03-29 DIAGNOSIS — I482 Chronic atrial fibrillation: Secondary | ICD-10-CM | POA: Diagnosis not present

## 2017-04-01 DIAGNOSIS — I2510 Atherosclerotic heart disease of native coronary artery without angina pectoris: Secondary | ICD-10-CM | POA: Diagnosis not present

## 2017-04-01 DIAGNOSIS — I350 Nonrheumatic aortic (valve) stenosis: Secondary | ICD-10-CM | POA: Diagnosis not present

## 2017-04-01 DIAGNOSIS — I4891 Unspecified atrial fibrillation: Secondary | ICD-10-CM | POA: Diagnosis not present

## 2017-04-01 DIAGNOSIS — I5020 Unspecified systolic (congestive) heart failure: Secondary | ICD-10-CM | POA: Diagnosis not present

## 2017-04-01 DIAGNOSIS — Z951 Presence of aortocoronary bypass graft: Secondary | ICD-10-CM | POA: Diagnosis not present

## 2017-04-01 DIAGNOSIS — I482 Chronic atrial fibrillation: Secondary | ICD-10-CM | POA: Diagnosis not present

## 2017-04-01 DIAGNOSIS — I714 Abdominal aortic aneurysm, without rupture: Secondary | ICD-10-CM | POA: Diagnosis not present

## 2017-04-01 DIAGNOSIS — R601 Generalized edema: Secondary | ICD-10-CM | POA: Diagnosis not present

## 2017-04-01 DIAGNOSIS — I255 Ischemic cardiomyopathy: Secondary | ICD-10-CM | POA: Diagnosis not present

## 2017-04-01 DIAGNOSIS — I509 Heart failure, unspecified: Secondary | ICD-10-CM | POA: Diagnosis not present

## 2017-04-03 DIAGNOSIS — I4891 Unspecified atrial fibrillation: Secondary | ICD-10-CM | POA: Diagnosis not present

## 2017-04-05 DIAGNOSIS — J069 Acute upper respiratory infection, unspecified: Secondary | ICD-10-CM | POA: Diagnosis not present

## 2017-04-05 DIAGNOSIS — R319 Hematuria, unspecified: Secondary | ICD-10-CM | POA: Diagnosis not present

## 2017-04-05 DIAGNOSIS — N3001 Acute cystitis with hematuria: Secondary | ICD-10-CM | POA: Diagnosis not present

## 2017-04-05 DIAGNOSIS — I350 Nonrheumatic aortic (valve) stenosis: Secondary | ICD-10-CM | POA: Diagnosis not present

## 2017-04-15 DIAGNOSIS — I2510 Atherosclerotic heart disease of native coronary artery without angina pectoris: Secondary | ICD-10-CM | POA: Diagnosis not present

## 2017-04-18 DIAGNOSIS — I714 Abdominal aortic aneurysm, without rupture: Secondary | ICD-10-CM | POA: Diagnosis not present

## 2017-04-18 DIAGNOSIS — I350 Nonrheumatic aortic (valve) stenosis: Secondary | ICD-10-CM | POA: Diagnosis not present

## 2017-04-18 DIAGNOSIS — I509 Heart failure, unspecified: Secondary | ICD-10-CM | POA: Diagnosis not present

## 2017-04-18 DIAGNOSIS — I255 Ischemic cardiomyopathy: Secondary | ICD-10-CM | POA: Diagnosis not present

## 2017-04-18 DIAGNOSIS — Z951 Presence of aortocoronary bypass graft: Secondary | ICD-10-CM | POA: Diagnosis not present

## 2017-04-18 DIAGNOSIS — I501 Left ventricular failure: Secondary | ICD-10-CM | POA: Diagnosis not present

## 2017-04-18 DIAGNOSIS — I480 Paroxysmal atrial fibrillation: Secondary | ICD-10-CM | POA: Diagnosis not present

## 2017-04-18 DIAGNOSIS — I272 Pulmonary hypertension, unspecified: Secondary | ICD-10-CM | POA: Diagnosis not present

## 2017-04-18 DIAGNOSIS — E8770 Fluid overload, unspecified: Secondary | ICD-10-CM | POA: Diagnosis not present

## 2017-04-18 DIAGNOSIS — J9811 Atelectasis: Secondary | ICD-10-CM | POA: Diagnosis not present

## 2017-04-18 DIAGNOSIS — I2510 Atherosclerotic heart disease of native coronary artery without angina pectoris: Secondary | ICD-10-CM | POA: Diagnosis not present

## 2017-04-26 DIAGNOSIS — Z1331 Encounter for screening for depression: Secondary | ICD-10-CM | POA: Diagnosis not present

## 2017-04-26 DIAGNOSIS — Z6826 Body mass index (BMI) 26.0-26.9, adult: Secondary | ICD-10-CM | POA: Diagnosis not present

## 2017-04-26 DIAGNOSIS — I110 Hypertensive heart disease with heart failure: Secondary | ICD-10-CM | POA: Diagnosis not present

## 2017-04-26 DIAGNOSIS — I5022 Chronic systolic (congestive) heart failure: Secondary | ICD-10-CM | POA: Diagnosis not present

## 2017-04-29 DIAGNOSIS — I700 Atherosclerosis of aorta: Secondary | ICD-10-CM | POA: Diagnosis not present

## 2017-04-29 DIAGNOSIS — R188 Other ascites: Secondary | ICD-10-CM | POA: Diagnosis not present

## 2017-04-29 DIAGNOSIS — I714 Abdominal aortic aneurysm, without rupture: Secondary | ICD-10-CM | POA: Diagnosis not present

## 2017-04-29 DIAGNOSIS — R935 Abnormal findings on diagnostic imaging of other abdominal regions, including retroperitoneum: Secondary | ICD-10-CM | POA: Diagnosis not present

## 2017-05-07 DIAGNOSIS — K263 Acute duodenal ulcer without hemorrhage or perforation: Secondary | ICD-10-CM | POA: Diagnosis not present

## 2017-05-07 DIAGNOSIS — E119 Type 2 diabetes mellitus without complications: Secondary | ICD-10-CM | POA: Diagnosis not present

## 2017-05-07 DIAGNOSIS — K2971 Gastritis, unspecified, with bleeding: Secondary | ICD-10-CM | POA: Diagnosis not present

## 2017-05-07 DIAGNOSIS — I4891 Unspecified atrial fibrillation: Secondary | ICD-10-CM | POA: Diagnosis not present

## 2017-05-07 DIAGNOSIS — I509 Heart failure, unspecified: Secondary | ICD-10-CM | POA: Diagnosis not present

## 2017-05-07 DIAGNOSIS — R188 Other ascites: Secondary | ICD-10-CM | POA: Diagnosis not present

## 2017-05-07 DIAGNOSIS — K7460 Unspecified cirrhosis of liver: Secondary | ICD-10-CM | POA: Diagnosis not present

## 2017-05-07 DIAGNOSIS — D649 Anemia, unspecified: Secondary | ICD-10-CM | POA: Diagnosis not present

## 2017-05-07 DIAGNOSIS — I10 Essential (primary) hypertension: Secondary | ICD-10-CM | POA: Diagnosis not present

## 2017-05-10 DIAGNOSIS — R1907 Generalized intra-abdominal and pelvic swelling, mass and lump: Secondary | ICD-10-CM | POA: Diagnosis not present

## 2017-05-10 DIAGNOSIS — J90 Pleural effusion, not elsewhere classified: Secondary | ICD-10-CM | POA: Diagnosis not present

## 2017-05-10 DIAGNOSIS — K4020 Bilateral inguinal hernia, without obstruction or gangrene, not specified as recurrent: Secondary | ICD-10-CM | POA: Diagnosis not present

## 2017-05-10 DIAGNOSIS — R188 Other ascites: Secondary | ICD-10-CM | POA: Diagnosis not present

## 2017-05-10 DIAGNOSIS — Z981 Arthrodesis status: Secondary | ICD-10-CM | POA: Diagnosis not present

## 2017-05-10 DIAGNOSIS — Z79899 Other long term (current) drug therapy: Secondary | ICD-10-CM | POA: Diagnosis not present

## 2017-05-28 DIAGNOSIS — R188 Other ascites: Secondary | ICD-10-CM | POA: Diagnosis not present

## 2017-05-28 DIAGNOSIS — D500 Iron deficiency anemia secondary to blood loss (chronic): Secondary | ICD-10-CM | POA: Diagnosis not present

## 2017-06-18 DIAGNOSIS — Z7901 Long term (current) use of anticoagulants: Secondary | ICD-10-CM | POA: Diagnosis not present

## 2017-06-18 DIAGNOSIS — D500 Iron deficiency anemia secondary to blood loss (chronic): Secondary | ICD-10-CM | POA: Diagnosis not present

## 2017-06-19 DIAGNOSIS — K2950 Unspecified chronic gastritis without bleeding: Secondary | ICD-10-CM | POA: Diagnosis not present

## 2017-06-19 DIAGNOSIS — K2980 Duodenitis without bleeding: Secondary | ICD-10-CM | POA: Diagnosis not present

## 2017-06-19 DIAGNOSIS — D125 Benign neoplasm of sigmoid colon: Secondary | ICD-10-CM | POA: Diagnosis not present

## 2017-06-19 DIAGNOSIS — I8510 Secondary esophageal varices without bleeding: Secondary | ICD-10-CM | POA: Diagnosis not present

## 2017-06-19 DIAGNOSIS — D128 Benign neoplasm of rectum: Secondary | ICD-10-CM | POA: Diagnosis not present

## 2017-06-19 DIAGNOSIS — D122 Benign neoplasm of ascending colon: Secondary | ICD-10-CM | POA: Diagnosis not present

## 2017-06-19 DIAGNOSIS — K766 Portal hypertension: Secondary | ICD-10-CM | POA: Diagnosis not present

## 2017-06-19 DIAGNOSIS — D126 Benign neoplasm of colon, unspecified: Secondary | ICD-10-CM | POA: Diagnosis not present

## 2017-06-19 DIAGNOSIS — R195 Other fecal abnormalities: Secondary | ICD-10-CM | POA: Diagnosis not present

## 2017-06-19 DIAGNOSIS — R188 Other ascites: Secondary | ICD-10-CM | POA: Diagnosis not present

## 2017-06-19 DIAGNOSIS — D500 Iron deficiency anemia secondary to blood loss (chronic): Secondary | ICD-10-CM | POA: Diagnosis not present

## 2017-06-19 DIAGNOSIS — K635 Polyp of colon: Secondary | ICD-10-CM | POA: Diagnosis not present

## 2017-07-19 DIAGNOSIS — D500 Iron deficiency anemia secondary to blood loss (chronic): Secondary | ICD-10-CM | POA: Diagnosis not present

## 2017-07-23 DIAGNOSIS — R188 Other ascites: Secondary | ICD-10-CM | POA: Diagnosis not present

## 2017-07-23 DIAGNOSIS — D500 Iron deficiency anemia secondary to blood loss (chronic): Secondary | ICD-10-CM | POA: Diagnosis not present

## 2017-07-23 DIAGNOSIS — Z1212 Encounter for screening for malignant neoplasm of rectum: Secondary | ICD-10-CM | POA: Diagnosis not present

## 2017-07-29 DIAGNOSIS — D500 Iron deficiency anemia secondary to blood loss (chronic): Secondary | ICD-10-CM | POA: Diagnosis not present

## 2017-09-22 IMAGING — DX DG CHEST 2V
2 series · 2 of 2 positions shown · non-contrast
Comparison: None in PACs

CLINICAL DATA: Routine examination. History of CHF, cardiomegaly,
CABG, diabetes, current smoker.

EXAM:
CHEST  2 VIEW

[chest pa]
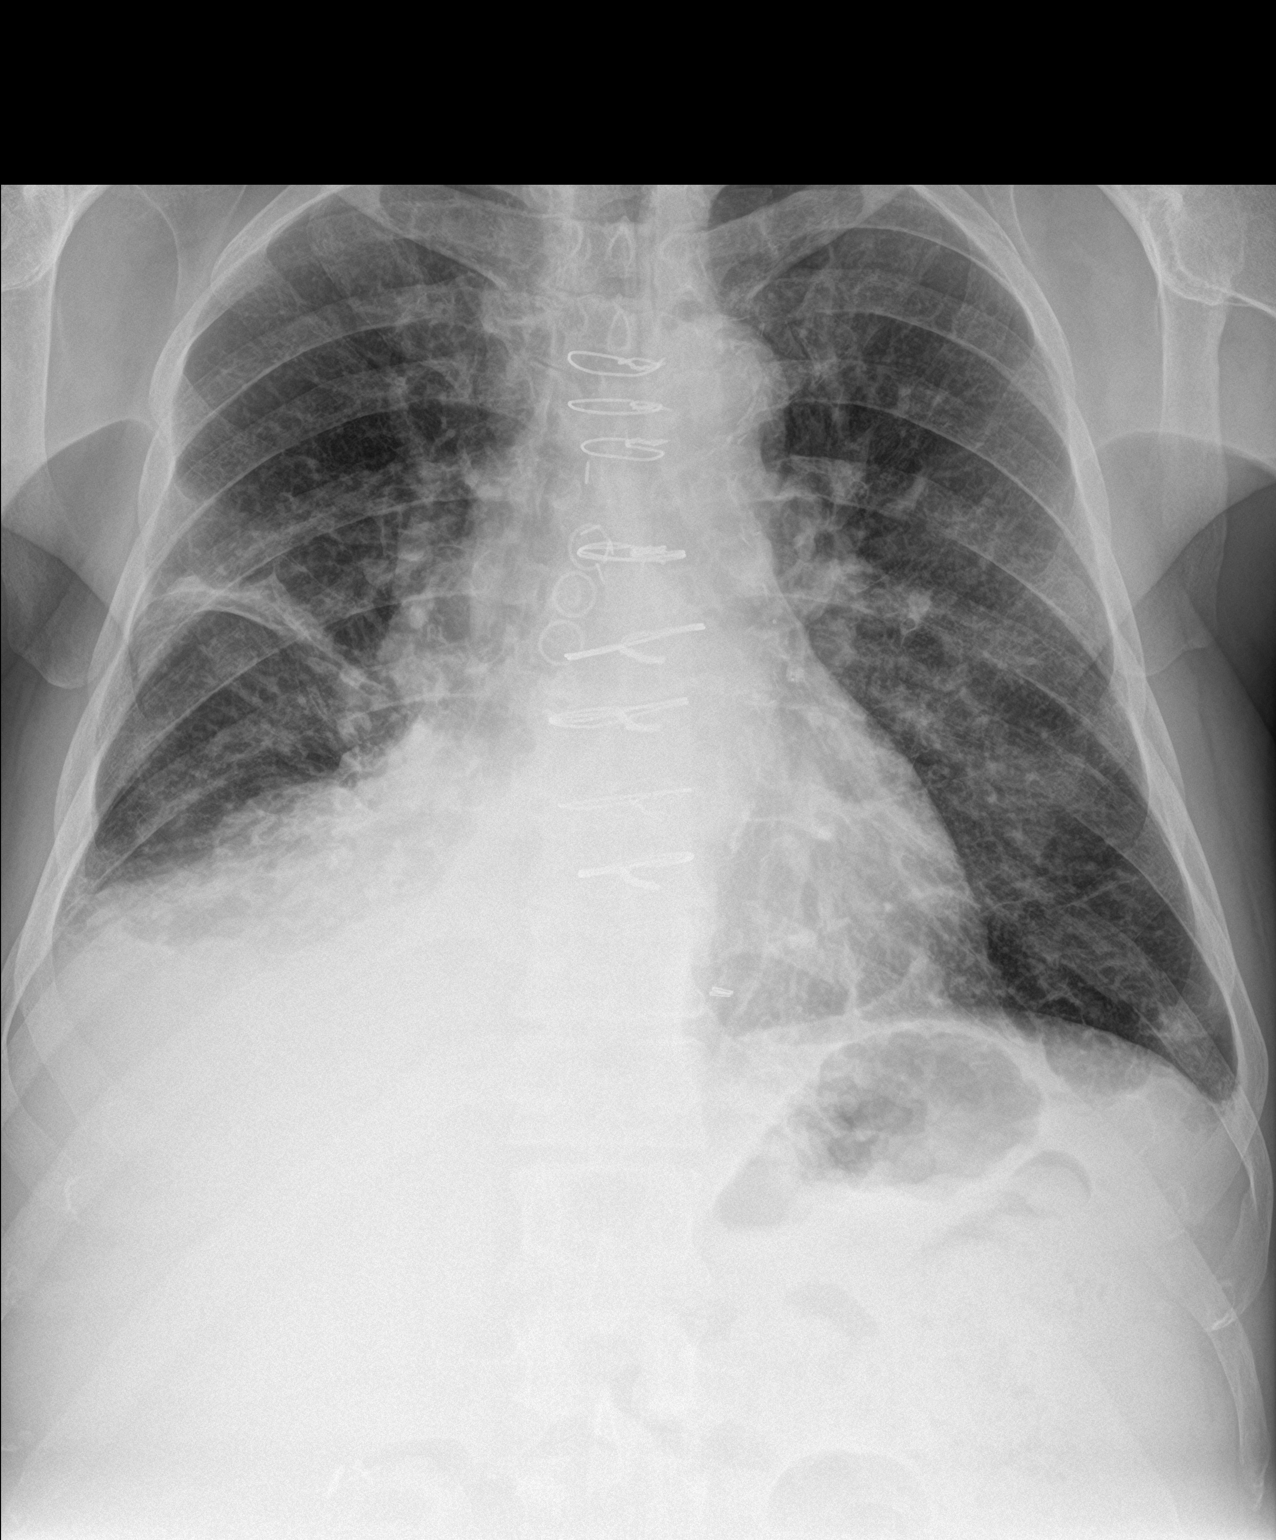

[chest lat]
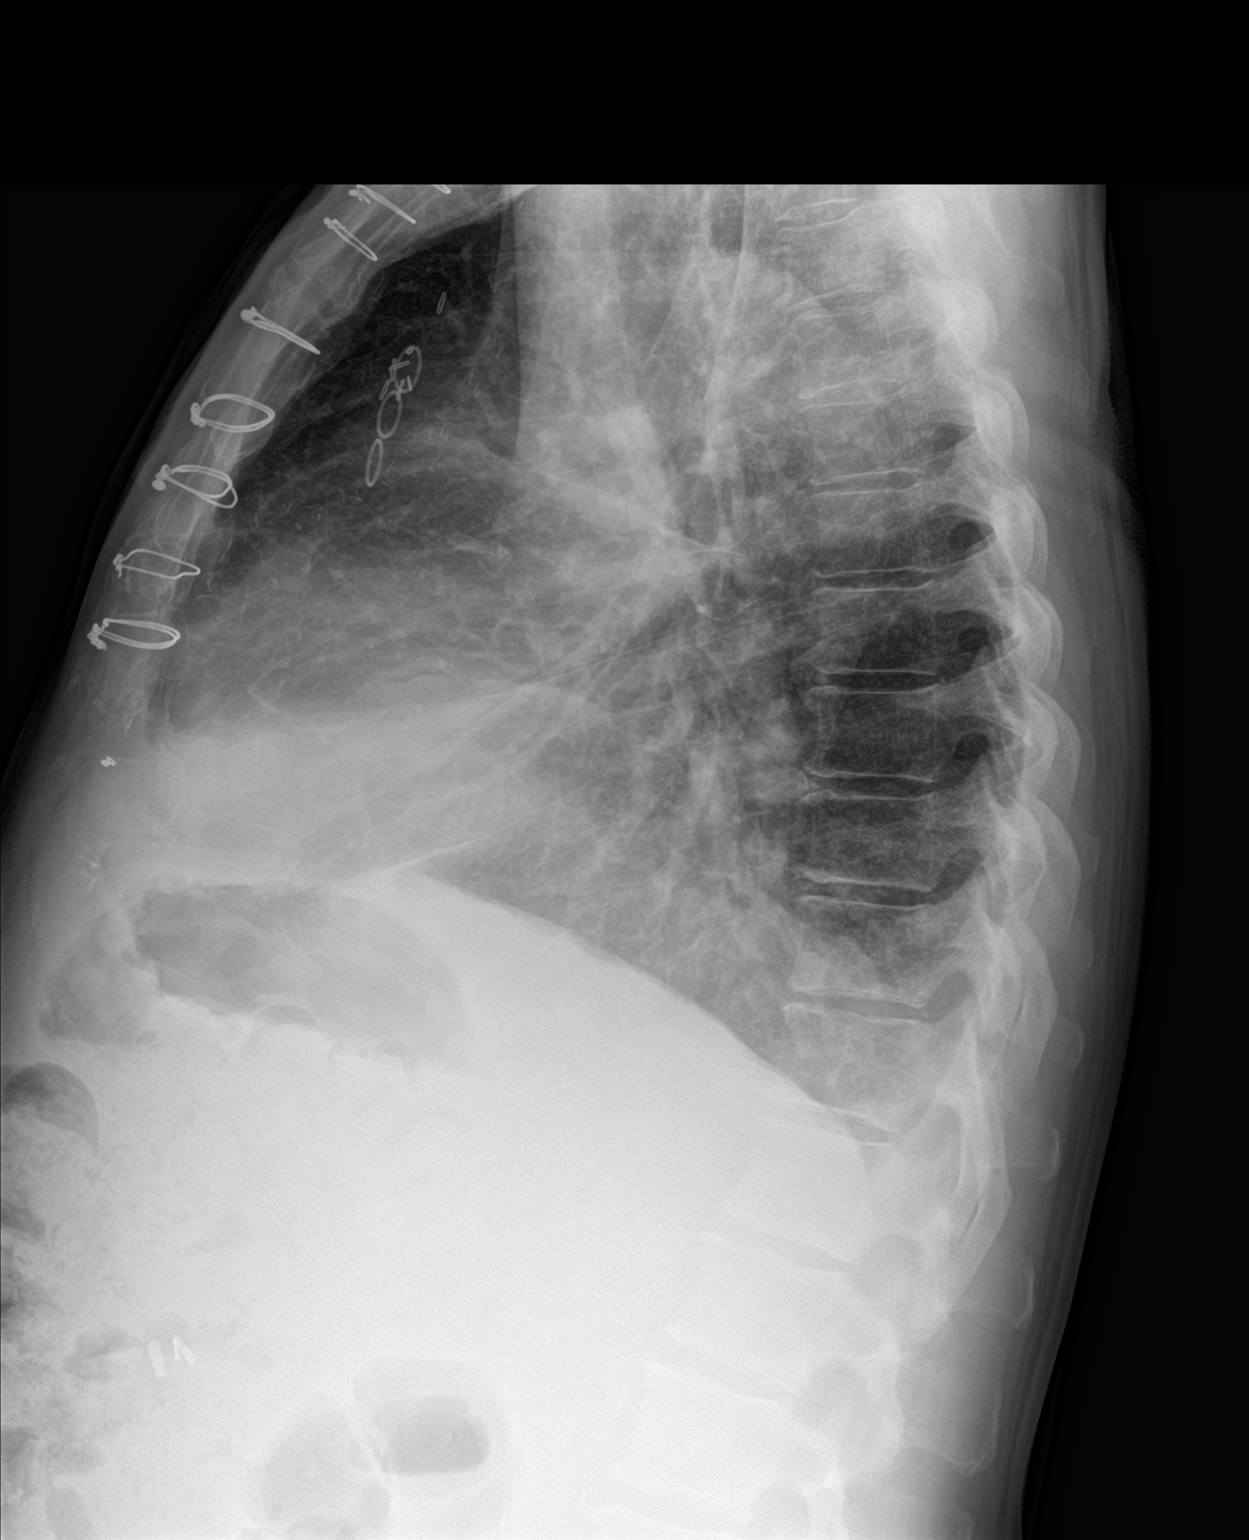

[2 of 2 positions shown; findings below may reference images not displayed]

FINDINGS: The lungs are well-expanded. There is bandlike soft tissue density
in the right mid lung. There is increased density at the right lung
base with blunting of the costophrenic angles. The left lung is
well-expanded. The interstitial markings of both lungs are coarse.
The heart and pulmonary vascularity are normal. There is
calcification in the wall of the thoracic aorta. There are post CABG
changes.
IMPRESSION: Increased interstitial markings in both lungs with confluent density
at the right lung base and in the right mid lung may reflect chronic
scarring given the lack of any symptoms, but without previous
studies it is difficult to judge if there has been significant
change. No overt CHF. Comparison with any outside radiographs would
be useful if these can be obtained. Further [HOSPITAL] this time
will need to be dictated upon clinical grounds.

Thoracic aortic atherosclerosis.

## 2017-11-04 DIAGNOSIS — R188 Other ascites: Secondary | ICD-10-CM | POA: Diagnosis not present

## 2017-12-04 DIAGNOSIS — R188 Other ascites: Secondary | ICD-10-CM | POA: Diagnosis not present

## 2017-12-09 DIAGNOSIS — R188 Other ascites: Secondary | ICD-10-CM | POA: Diagnosis not present

## 2017-12-09 DIAGNOSIS — Z86010 Personal history of colonic polyps: Secondary | ICD-10-CM | POA: Diagnosis not present

## 2017-12-09 DIAGNOSIS — I8500 Esophageal varices without bleeding: Secondary | ICD-10-CM | POA: Diagnosis not present

## 2017-12-09 DIAGNOSIS — Z888 Allergy status to other drugs, medicaments and biological substances status: Secondary | ICD-10-CM | POA: Diagnosis not present

## 2017-12-12 DIAGNOSIS — R188 Other ascites: Secondary | ICD-10-CM | POA: Diagnosis not present

## 2017-12-23 IMAGING — CT CT CHEST W/O CM
2 of 3 series · 15 of 36 positions shown, 18 images · non-contrast
Comparison: 04/11/2016 chest radiograph. No prior chest CT is
available for comparison at the time of this dictation.

ADDENDUM:
Prior chest CT from 04/10/2016 is now available for comparison.

The previously questioned 11 mm medial left lower lobe pulmonary
nodule on the 04/10/2016 chest CT report is not apparent on the
07/12/2016 chest CT, and was probably due to a combination of
resolved interstitial edema and atelectasis.
Moderate right pleural effusion is decreased since 04/10/2016.
The mildly enlarged right paratracheal, subcarinal, AP window and
gastrohepatic ligament lymph nodes are all stable since 04/10/2016,
suggesting benign reactive nodes.
CLINICAL DATA: Former smoker. Reported 11 mm left lower lobe
pulmonary nodule on outside chest CT performed 04/10/2016,
presenting for follow-up.
EXAM:
CT CHEST WITHOUT CONTRAST
TECHNIQUE: Multidetector CT imaging of the chest was performed following the
standard protocol without IV contrast.

[Series 2: thorax · axial · 0.76mm/px · z∈[-314,-16]mm · 12 of 177 slices shown, 15 images]
[im 14/177  mediastinal]
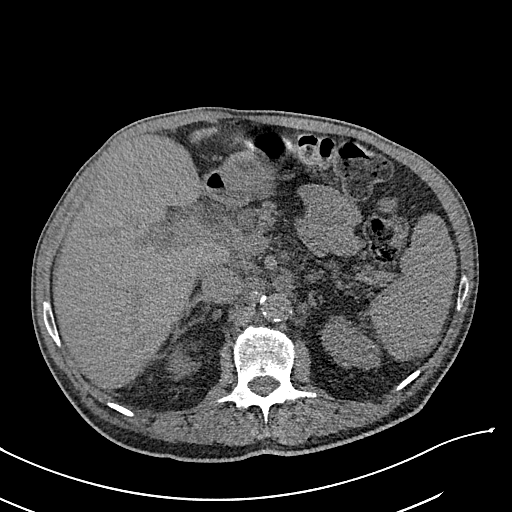
[im 14/177  lung]
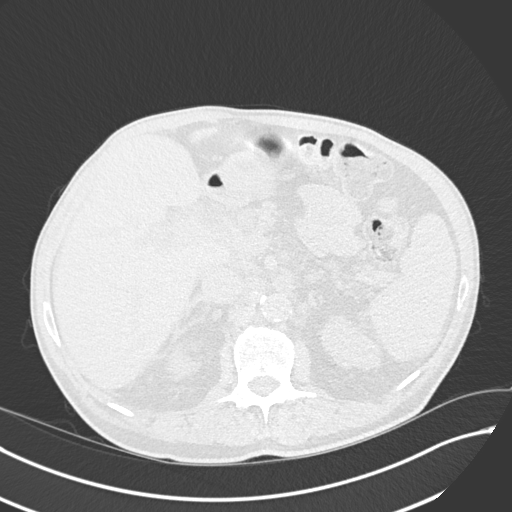
[im 27/177  lung]
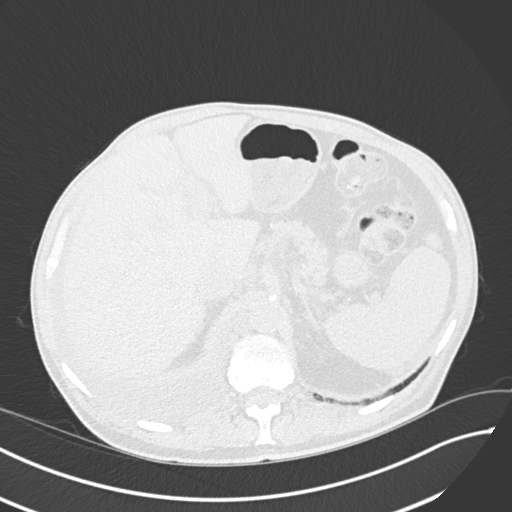
[im 40/177  lung]
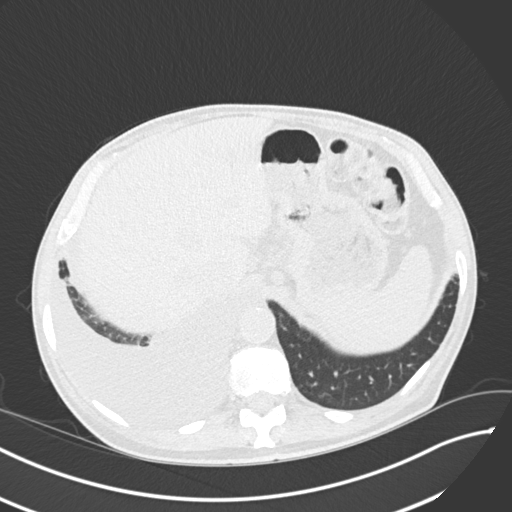
[im 53/177  lung]
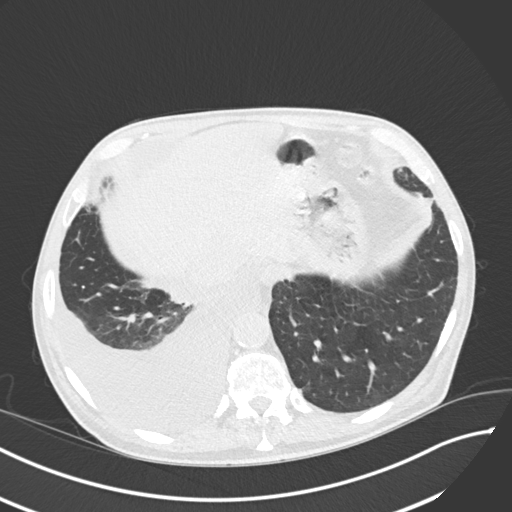
[im 66/177  mediastinal]
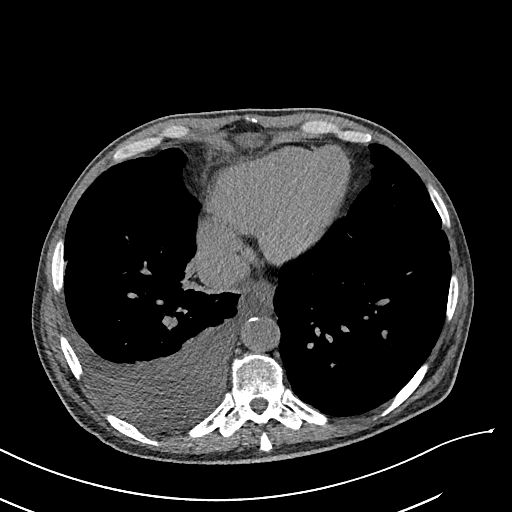
[im 66/177  lung]
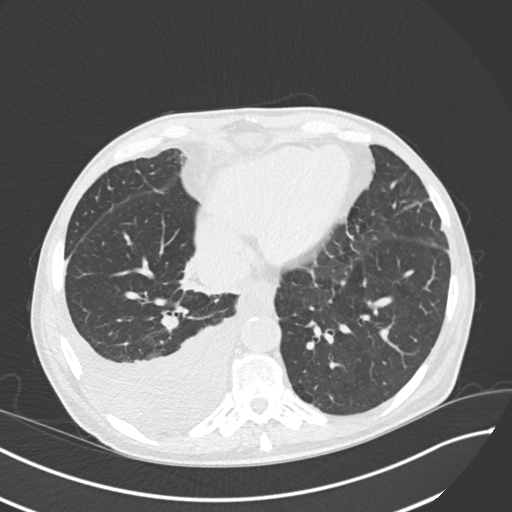
[im 79/177  lung]
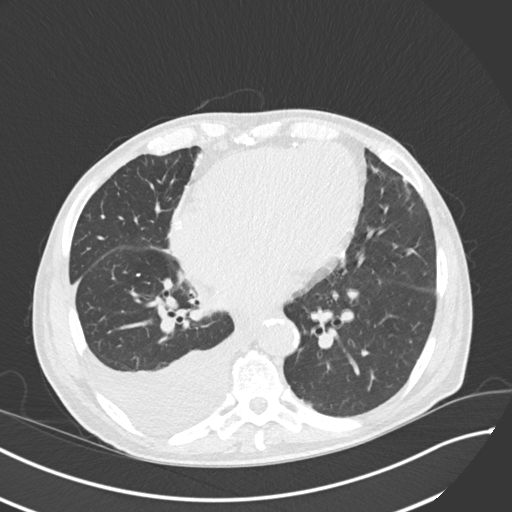
[im 98/177  lung]
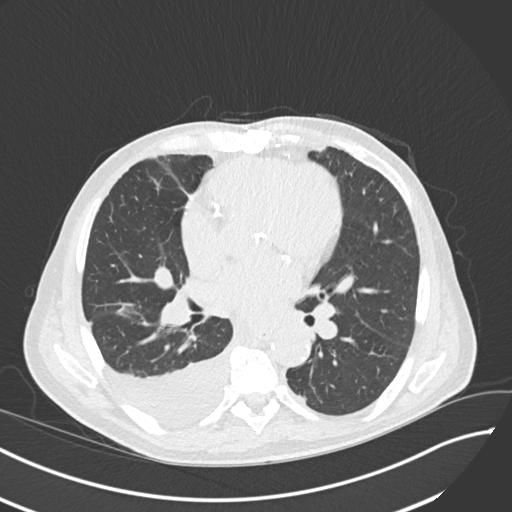
[im 111/177  lung]
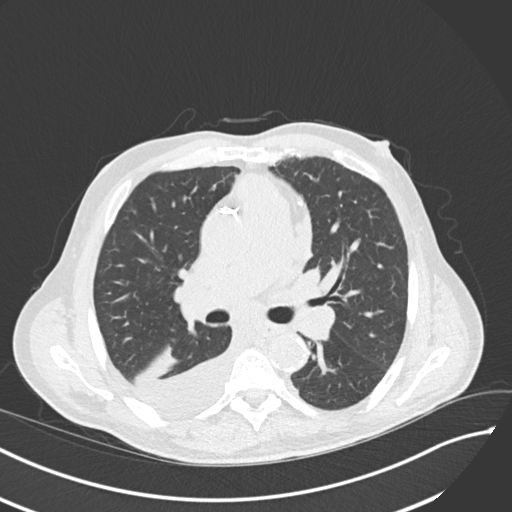
[im 124/177  mediastinal]
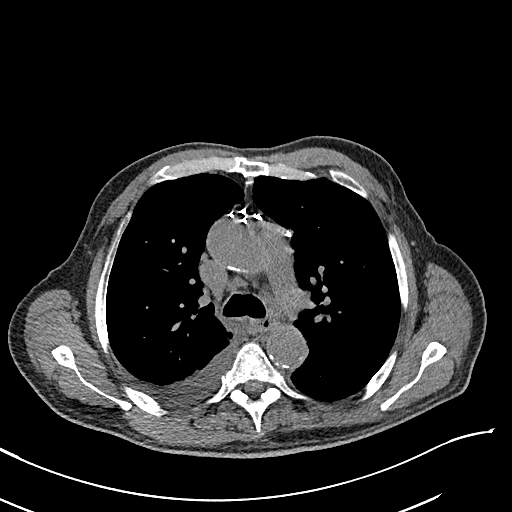
[im 124/177  lung]
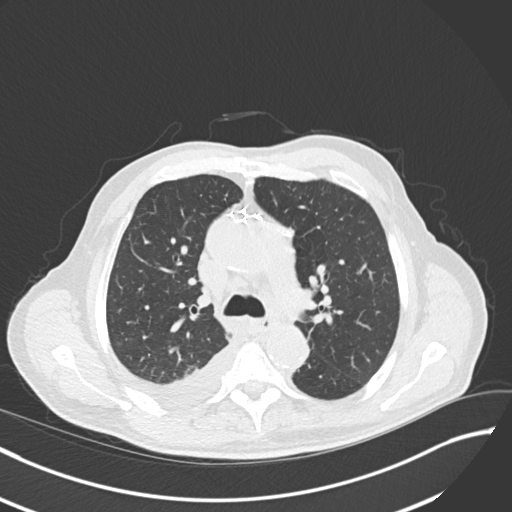
[im 137/177  lung]
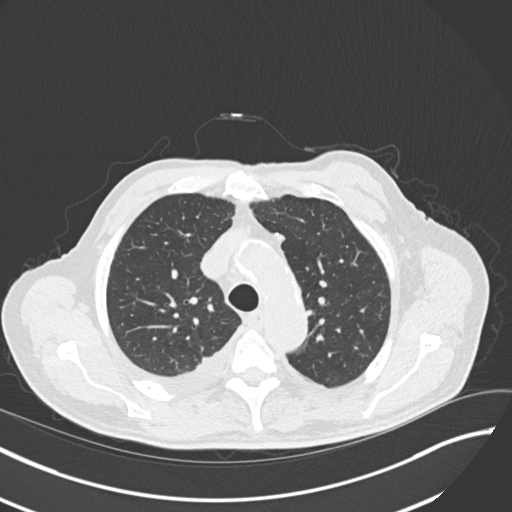
[im 150/177  lung]
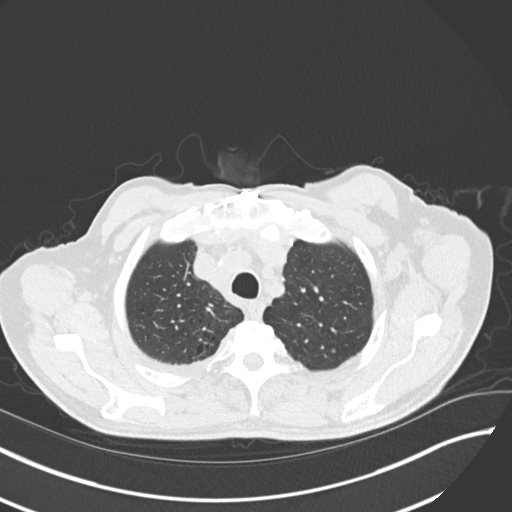
[im 163/177  lung]
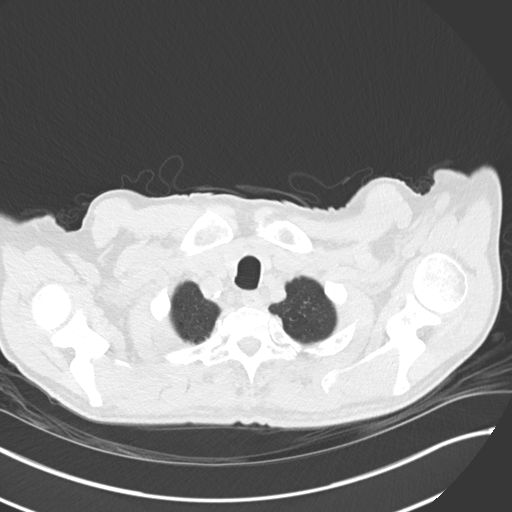

[Series 5: coronal · coronal · 0.69mm/px · 3 of 132 slices shown]
[im 27/132  lung]
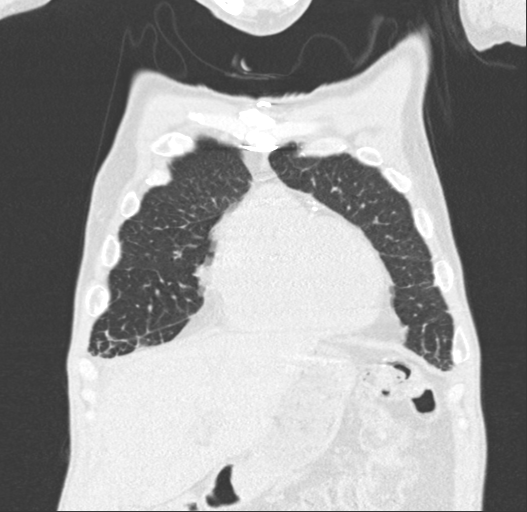
[im 53/132  lung]
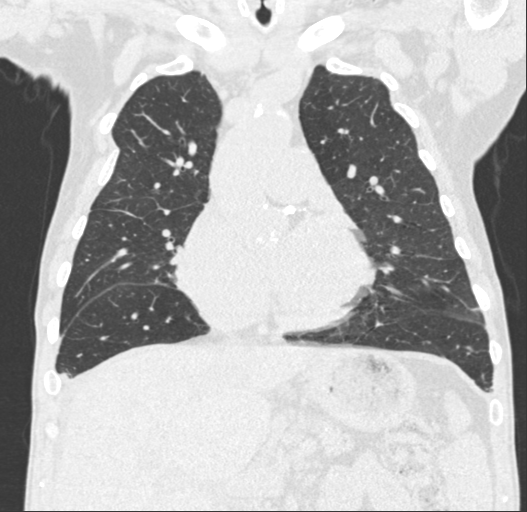
[im 79/132  lung]
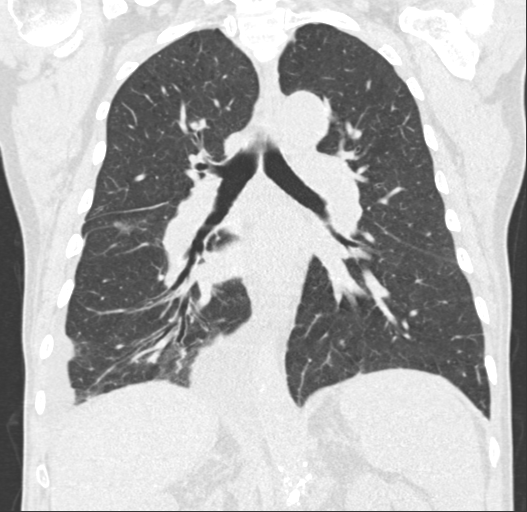

[15 of 36 positions shown; findings below may reference images not displayed]

FINDINGS: Cardiovascular: Top-normal heart size. No significant pericardial
fluid/thickening. Left main, left anterior descending, left
circumflex and right coronary atherosclerosis status post CABG.
Atherosclerotic nonaneurysmal thoracic aorta. Top-normal caliber
main pulmonary artery (3.2 cm diameter).

Mediastinum/Nodes: No discrete thyroid nodules. Unremarkable
esophagus. No axillary adenopathy. Mild right paratracheal
adenopathy measuring up to 1.0 cm (series 2/ image 56). Mildly
enlarged 1.0 cm subcarinal node (series 2/ image 69). Mildly
enlarged 1.0 cm AP window node (series 2/ image 50). No additional
pathologically enlarged mediastinal or gross hilar nodes on this
noncontrast scan .

Lungs/Pleura: No pneumothorax. Moderate dependent right pleural
effusion. No left pleural effusion. Mild centrilobular and
paraseptal emphysema with mild diffuse bronchial wall thickening.
Calcified 3 mm bilateral lower lobe granulomas. There is moderate
compressive atelectasis in the right lower lobe. No acute
consolidative airspace disease, lung masses or significant pulmonary
nodules. Scattered small parenchymal bands in the mid to lower lungs
bilaterally are compatible with mild postinfectious/
postinflammatory scarring.

Upper abdomen: Cholecystectomy. Mild gastrohepatic ligament
lymphadenopathy measuring up to 1.0 cm (series 2/ image 142).

Musculoskeletal: No aggressive appearing focal osseous lesions.
Intact sternotomy wires. Mild thoracic spondylosis.
IMPRESSION: 1. Moderate dependent right pleural effusion with moderate
compressive right lower lobe atelectasis.
2. No acute consolidative airspace disease, lung masses or
significant pulmonary nodules. Specifically, no evidence of a left
lower lobe pulmonary nodule. If possible, please submit the outside
chest CT study for direct comparison. An addendum will be issued if
the outside chest CT is submitted.
3. Nonspecific mild mediastinal and gastrohepatic ligament
lymphadenopathy. Consider follow-up CT of the chest and abdomen with
IV contrast in 3 months to document stability of these nodes.
4. Aortic atherosclerosis. Left main and 3 vessel coronary
atherosclerosis status post CABG.
5. Mild emphysema with mild diffuse bronchial wall thickening,
suggesting COPD.

## 2018-01-06 DIAGNOSIS — Z79899 Other long term (current) drug therapy: Secondary | ICD-10-CM | POA: Diagnosis not present

## 2018-01-06 DIAGNOSIS — R188 Other ascites: Secondary | ICD-10-CM | POA: Diagnosis not present

## 2018-01-06 DIAGNOSIS — I10 Essential (primary) hypertension: Secondary | ICD-10-CM | POA: Diagnosis not present

## 2018-01-06 DIAGNOSIS — I252 Old myocardial infarction: Secondary | ICD-10-CM | POA: Diagnosis not present

## 2018-01-07 DIAGNOSIS — R188 Other ascites: Secondary | ICD-10-CM | POA: Diagnosis not present

## 2018-01-09 DIAGNOSIS — R188 Other ascites: Secondary | ICD-10-CM | POA: Diagnosis not present

## 2018-01-09 DIAGNOSIS — J90 Pleural effusion, not elsewhere classified: Secondary | ICD-10-CM | POA: Diagnosis not present

## 2018-01-09 DIAGNOSIS — Z9049 Acquired absence of other specified parts of digestive tract: Secondary | ICD-10-CM | POA: Diagnosis not present

## 2018-03-05 DIAGNOSIS — R6520 Severe sepsis without septic shock: Secondary | ICD-10-CM | POA: Diagnosis not present

## 2018-03-05 DIAGNOSIS — E872 Acidosis: Secondary | ICD-10-CM | POA: Diagnosis not present

## 2018-03-05 DIAGNOSIS — R509 Fever, unspecified: Secondary | ICD-10-CM | POA: Diagnosis not present

## 2018-03-05 DIAGNOSIS — K7290 Hepatic failure, unspecified without coma: Secondary | ICD-10-CM | POA: Diagnosis not present

## 2018-03-05 DIAGNOSIS — J9600 Acute respiratory failure, unspecified whether with hypoxia or hypercapnia: Secondary | ICD-10-CM

## 2018-03-05 DIAGNOSIS — R609 Edema, unspecified: Secondary | ICD-10-CM | POA: Diagnosis not present

## 2018-03-05 DIAGNOSIS — R531 Weakness: Secondary | ICD-10-CM | POA: Diagnosis not present

## 2018-03-05 DIAGNOSIS — J189 Pneumonia, unspecified organism: Secondary | ICD-10-CM | POA: Diagnosis not present

## 2018-03-05 DIAGNOSIS — I252 Old myocardial infarction: Secondary | ICD-10-CM | POA: Diagnosis not present

## 2018-03-05 DIAGNOSIS — A419 Sepsis, unspecified organism: Secondary | ICD-10-CM | POA: Diagnosis not present

## 2018-03-05 DIAGNOSIS — R0902 Hypoxemia: Secondary | ICD-10-CM | POA: Diagnosis not present

## 2018-03-05 DIAGNOSIS — I509 Heart failure, unspecified: Secondary | ICD-10-CM | POA: Diagnosis not present

## 2018-03-05 DIAGNOSIS — I959 Hypotension, unspecified: Secondary | ICD-10-CM | POA: Diagnosis not present

## 2018-03-05 DIAGNOSIS — J9690 Respiratory failure, unspecified, unspecified whether with hypoxia or hypercapnia: Secondary | ICD-10-CM | POA: Diagnosis not present

## 2018-03-05 DIAGNOSIS — N19 Unspecified kidney failure: Secondary | ICD-10-CM | POA: Diagnosis not present

## 2018-03-05 DIAGNOSIS — K7210 Chronic hepatic failure without coma: Secondary | ICD-10-CM | POA: Diagnosis not present

## 2018-03-05 DIAGNOSIS — R188 Other ascites: Secondary | ICD-10-CM | POA: Diagnosis not present

## 2018-03-05 DIAGNOSIS — E7800 Pure hypercholesterolemia, unspecified: Secondary | ICD-10-CM | POA: Diagnosis not present

## 2018-03-05 DIAGNOSIS — I10 Essential (primary) hypertension: Secondary | ICD-10-CM | POA: Diagnosis not present

## 2018-03-05 DIAGNOSIS — J9601 Acute respiratory failure with hypoxia: Secondary | ICD-10-CM | POA: Diagnosis not present

## 2018-03-05 DIAGNOSIS — K922 Gastrointestinal hemorrhage, unspecified: Secondary | ICD-10-CM | POA: Diagnosis not present

## 2018-03-05 DIAGNOSIS — R918 Other nonspecific abnormal finding of lung field: Secondary | ICD-10-CM | POA: Diagnosis not present

## 2018-03-05 DIAGNOSIS — J90 Pleural effusion, not elsewhere classified: Secondary | ICD-10-CM | POA: Diagnosis not present

## 2018-03-06 ENCOUNTER — Inpatient Hospital Stay (HOSPITAL_COMMUNITY)
Admission: AD | Admit: 2018-03-06 | Discharge: 2018-03-09 | DRG: 871 | Disposition: A | Discharge status: Home / Self Care | Payer: Medicare Other | Source: Other Acute Inpatient Hospital | Attending: Internal Medicine | Admitting: Internal Medicine

## 2018-03-06 DIAGNOSIS — R188 Other ascites: Secondary | ICD-10-CM | POA: Diagnosis not present

## 2018-03-06 DIAGNOSIS — Z79899 Other long term (current) drug therapy: Secondary | ICD-10-CM

## 2018-03-06 DIAGNOSIS — K922 Gastrointestinal hemorrhage, unspecified: Secondary | ICD-10-CM | POA: Diagnosis not present

## 2018-03-06 DIAGNOSIS — R0602 Shortness of breath: Secondary | ICD-10-CM

## 2018-03-06 DIAGNOSIS — I255 Ischemic cardiomyopathy: Secondary | ICD-10-CM | POA: Diagnosis present

## 2018-03-06 DIAGNOSIS — I2510 Atherosclerotic heart disease of native coronary artery without angina pectoris: Secondary | ICD-10-CM | POA: Diagnosis present

## 2018-03-06 DIAGNOSIS — E7800 Pure hypercholesterolemia, unspecified: Secondary | ICD-10-CM | POA: Diagnosis present

## 2018-03-06 DIAGNOSIS — D62 Acute posthemorrhagic anemia: Secondary | ICD-10-CM | POA: Diagnosis not present

## 2018-03-06 DIAGNOSIS — N179 Acute kidney failure, unspecified: Secondary | ICD-10-CM | POA: Diagnosis present

## 2018-03-06 DIAGNOSIS — Z951 Presence of aortocoronary bypass graft: Secondary | ICD-10-CM

## 2018-03-06 DIAGNOSIS — K7460 Unspecified cirrhosis of liver: Secondary | ICD-10-CM | POA: Diagnosis present

## 2018-03-06 DIAGNOSIS — A4150 Gram-negative sepsis, unspecified: Secondary | ICD-10-CM | POA: Diagnosis not present

## 2018-03-06 DIAGNOSIS — N189 Chronic kidney disease, unspecified: Secondary | ICD-10-CM | POA: Diagnosis present

## 2018-03-06 DIAGNOSIS — J181 Lobar pneumonia, unspecified organism: Secondary | ICD-10-CM | POA: Diagnosis not present

## 2018-03-06 DIAGNOSIS — I5022 Chronic systolic (congestive) heart failure: Secondary | ICD-10-CM | POA: Diagnosis not present

## 2018-03-06 DIAGNOSIS — Z8673 Personal history of transient ischemic attack (TIA), and cerebral infarction without residual deficits: Secondary | ICD-10-CM | POA: Diagnosis not present

## 2018-03-06 DIAGNOSIS — J189 Pneumonia, unspecified organism: Secondary | ICD-10-CM | POA: Diagnosis not present

## 2018-03-06 DIAGNOSIS — I5042 Chronic combined systolic (congestive) and diastolic (congestive) heart failure: Secondary | ICD-10-CM | POA: Diagnosis not present

## 2018-03-06 DIAGNOSIS — K920 Hematemesis: Secondary | ICD-10-CM | POA: Diagnosis not present

## 2018-03-06 DIAGNOSIS — D631 Anemia in chronic kidney disease: Secondary | ICD-10-CM | POA: Diagnosis present

## 2018-03-06 DIAGNOSIS — Z87891 Personal history of nicotine dependence: Secondary | ICD-10-CM

## 2018-03-06 DIAGNOSIS — I4891 Unspecified atrial fibrillation: Secondary | ICD-10-CM | POA: Diagnosis present

## 2018-03-06 DIAGNOSIS — I130 Hypertensive heart and chronic kidney disease with heart failure and stage 1 through stage 4 chronic kidney disease, or unspecified chronic kidney disease: Secondary | ICD-10-CM | POA: Diagnosis not present

## 2018-03-06 DIAGNOSIS — Z8249 Family history of ischemic heart disease and other diseases of the circulatory system: Secondary | ICD-10-CM | POA: Diagnosis not present

## 2018-03-06 DIAGNOSIS — J811 Chronic pulmonary edema: Secondary | ICD-10-CM | POA: Diagnosis not present

## 2018-03-06 DIAGNOSIS — J9601 Acute respiratory failure with hypoxia: Secondary | ICD-10-CM | POA: Diagnosis not present

## 2018-03-06 DIAGNOSIS — E1159 Type 2 diabetes mellitus with other circulatory complications: Secondary | ICD-10-CM | POA: Diagnosis present

## 2018-03-06 DIAGNOSIS — A419 Sepsis, unspecified organism: Secondary | ICD-10-CM | POA: Diagnosis not present

## 2018-03-06 DIAGNOSIS — K7290 Hepatic failure, unspecified without coma: Secondary | ICD-10-CM | POA: Diagnosis not present

## 2018-03-06 DIAGNOSIS — E1122 Type 2 diabetes mellitus with diabetic chronic kidney disease: Secondary | ICD-10-CM | POA: Diagnosis present

## 2018-03-06 DIAGNOSIS — N19 Unspecified kidney failure: Secondary | ICD-10-CM | POA: Diagnosis not present

## 2018-03-06 DIAGNOSIS — Z7984 Long term (current) use of oral hypoglycemic drugs: Secondary | ICD-10-CM | POA: Diagnosis not present

## 2018-03-06 HISTORY — DX: Atherosclerotic heart disease of native coronary artery without angina pectoris: I25.10

## 2018-03-06 MED ORDER — PANTOPRAZOLE SODIUM 40 MG IV SOLR: 40.0000 mg | Freq: Two times a day (BID) | INTRAVENOUS | Status: DC

## 2018-03-06 MED ORDER — PANTOPRAZOLE SODIUM 40 MG IV SOLR
8.0000 mg/h | INTRAVENOUS | Status: DC
  Administered 2018-03-07: 8 mg/h via INTRAVENOUS
  Filled 2018-03-06 (×2): qty 80

## 2018-03-07 ENCOUNTER — Other Ambulatory Visit: Payer: Self-pay

## 2018-03-07 ENCOUNTER — Encounter (HOSPITAL_COMMUNITY): Payer: Self-pay | Admitting: Internal Medicine

## 2018-03-07 DIAGNOSIS — K7460 Unspecified cirrhosis of liver: Secondary | ICD-10-CM

## 2018-03-07 DIAGNOSIS — I5022 Chronic systolic (congestive) heart failure: Secondary | ICD-10-CM

## 2018-03-07 DIAGNOSIS — D631 Anemia in chronic kidney disease: Secondary | ICD-10-CM | POA: Diagnosis present

## 2018-03-07 DIAGNOSIS — N189 Chronic kidney disease, unspecified: Secondary | ICD-10-CM

## 2018-03-07 DIAGNOSIS — I2510 Atherosclerotic heart disease of native coronary artery without angina pectoris: Secondary | ICD-10-CM | POA: Diagnosis present

## 2018-03-07 DIAGNOSIS — E1159 Type 2 diabetes mellitus with other circulatory complications: Secondary | ICD-10-CM | POA: Diagnosis present

## 2018-03-07 DIAGNOSIS — R188 Other ascites: Secondary | ICD-10-CM

## 2018-03-07 DIAGNOSIS — N179 Acute kidney failure, unspecified: Secondary | ICD-10-CM

## 2018-03-07 DIAGNOSIS — J189 Pneumonia, unspecified organism: Secondary | ICD-10-CM | POA: Diagnosis present

## 2018-03-07 DIAGNOSIS — K922 Gastrointestinal hemorrhage, unspecified: Secondary | ICD-10-CM | POA: Diagnosis present

## 2018-03-07 LAB — URINALYSIS, ROUTINE W REFLEX MICROSCOPIC
Bilirubin Urine: NEGATIVE
GLUCOSE, UA: NEGATIVE mg/dL
Ketones, ur: NEGATIVE mg/dL
NITRITE: NEGATIVE
Protein, ur: 30 mg/dL — AB
RBC / HPF: >50 RBC/hpf — ABNORMAL HIGH (ref 0–5)
Specific Gravity, Urine: 1.011 (ref 1.005–1.030)
pH: 6 (ref 5.0–8.0)

## 2018-03-07 LAB — CBC
HCT: 30.2 % — ABNORMAL LOW (ref 39.0–52.0)
HCT: 32.6 % — ABNORMAL LOW (ref 39.0–52.0)
HEMATOCRIT: 30.1 % — AB (ref 39.0–52.0)
Hemoglobin: 10.3 g/dL — ABNORMAL LOW (ref 13.0–17.0)
Hemoglobin: 9.4 g/dL — ABNORMAL LOW (ref 13.0–17.0)
Hemoglobin: 9.6 g/dL — ABNORMAL LOW (ref 13.0–17.0)
MCH: 28.4 pg (ref 26.0–34.0)
MCH: 28.6 pg (ref 26.0–34.0)
MCH: 28.9 pg (ref 26.0–34.0)
MCHC: 31.2 g/dL (ref 30.0–36.0)
MCHC: 31.6 g/dL (ref 30.0–36.0)
MCHC: 31.8 g/dL (ref 30.0–36.0)
MCV: 89.9 fL (ref 80.0–100.0)
MCV: 90.9 fL (ref 80.0–100.0)
MCV: 91.3 fL (ref 80.0–100.0)
Platelets: 140 10*3/uL — ABNORMAL LOW (ref 150–400)
Platelets: 140 10*3/uL — ABNORMAL LOW (ref 150–400)
Platelets: 145 10*3/uL — ABNORMAL LOW (ref 150–400)
RBC: 3.31 MIL/uL — AB (ref 4.22–5.81)
RBC: 3.36 MIL/uL — ABNORMAL LOW (ref 4.22–5.81)
RBC: 3.57 MIL/uL — AB (ref 4.22–5.81)
RDW: 15 % (ref 11.5–15.5)
RDW: 15.1 % (ref 11.5–15.5)
RDW: 15.2 % (ref 11.5–15.5)
WBC: 11.1 10*3/uL — ABNORMAL HIGH (ref 4.0–10.5)
WBC: 9.1 10*3/uL (ref 4.0–10.5)
WBC: 9.9 10*3/uL (ref 4.0–10.5)
nRBC: 0 % (ref 0.0–0.2)
nRBC: 0 % (ref 0.0–0.2)
nRBC: 0 % (ref 0.0–0.2)

## 2018-03-07 LAB — CORTISOL: Cortisol, Plasma: 18 ug/dL

## 2018-03-07 LAB — PROTIME-INR
INR: 1.72
Prothrombin Time: 20 seconds — ABNORMAL HIGH (ref 11.4–15.2)

## 2018-03-07 LAB — HIV ANTIBODY (ROUTINE TESTING W REFLEX): HIV Screen 4th Generation wRfx: NONREACTIVE

## 2018-03-07 LAB — STREP PNEUMONIAE URINARY ANTIGEN: Strep Pneumo Urinary Antigen: NEGATIVE

## 2018-03-07 LAB — GLUCOSE, CAPILLARY
Glucose-Capillary: 133 mg/dL — ABNORMAL HIGH (ref 70–99)
Glucose-Capillary: 106 mg/dL — ABNORMAL HIGH (ref 70–99)
Glucose-Capillary: 153 mg/dL — ABNORMAL HIGH (ref 70–99)
Glucose-Capillary: 106 mg/dL — ABNORMAL HIGH (ref 70–99)
Glucose-Capillary: 123 mg/dL — ABNORMAL HIGH (ref 70–99)

## 2018-03-07 LAB — BASIC METABOLIC PANEL
ANION GAP: 8 (ref 5–15)
Anion gap: 6 (ref 5–15)
BUN: 76 mg/dL — ABNORMAL HIGH (ref 8–23)
BUN: 70 mg/dL — ABNORMAL HIGH (ref 8–23)
CALCIUM: 8.2 mg/dL — AB (ref 8.9–10.3)
CO2: 17 mmol/L — ABNORMAL LOW (ref 22–32)
CO2: 18 mmol/L — AB (ref 22–32)
Calcium: 8.1 mg/dL — ABNORMAL LOW (ref 8.9–10.3)
Chloride: 114 mmol/L — ABNORMAL HIGH (ref 98–111)
Chloride: 113 mmol/L — ABNORMAL HIGH (ref 98–111)
Creatinine, Ser: 1.83 mg/dL — ABNORMAL HIGH (ref 0.61–1.24)
Creatinine, Ser: 1.96 mg/dL — ABNORMAL HIGH (ref 0.61–1.24)
GFR calc Af Amer: 42 mL/min — ABNORMAL LOW (ref >60)
GFR calc Af Amer: 39 mL/min — ABNORMAL LOW (ref >60)
GFR calc non Af Amer: 37 mL/min — ABNORMAL LOW (ref >60)
GFR calc non Af Amer: 34 mL/min — ABNORMAL LOW (ref >60)
Glucose, Bld: 148 mg/dL — ABNORMAL HIGH (ref 70–99)
Glucose, Bld: 145 mg/dL — ABNORMAL HIGH (ref 70–99)
POTASSIUM: 3.9 mmol/L (ref 3.5–5.1)
Potassium: 3.5 mmol/L (ref 3.5–5.1)
Sodium: 138 mmol/L (ref 135–145)
Sodium: 138 mmol/L (ref 135–145)

## 2018-03-07 LAB — EXPECTORATED SPUTUM ASSESSMENT W GRAM STAIN, RFLX TO RESP C

## 2018-03-07 LAB — HEPATIC FUNCTION PANEL
ALT: 10 U/L (ref 0–44)
AST: 24 U/L (ref 15–41)
Albumin: 3.6 g/dL (ref 3.5–5.0)
Alkaline Phosphatase: 53 U/L (ref 38–126)
BILIRUBIN INDIRECT: 0.9 mg/dL (ref 0.3–0.9)
Bilirubin, Direct: 0.5 mg/dL — ABNORMAL HIGH (ref 0.0–0.2)
Total Bilirubin: 1.4 mg/dL — ABNORMAL HIGH (ref 0.3–1.2)
Total Protein: 7 g/dL (ref 6.5–8.1)

## 2018-03-07 LAB — ABO/RH: ABO/RH(D): A POS

## 2018-03-07 LAB — MRSA PCR SCREENING: MRSA by PCR: NEGATIVE

## 2018-03-07 LAB — SODIUM, URINE, RANDOM: Sodium, Ur: 16 mmol/L

## 2018-03-07 LAB — TYPE AND SCREEN
ABO/RH(D): A POS
Antibody Screen: NEGATIVE

## 2018-03-07 LAB — TROPONIN I: Troponin I: 0.04 ng/mL (ref <0.03)

## 2018-03-07 LAB — LACTIC ACID, PLASMA
LACTIC ACID, VENOUS: 1.2 mmol/L (ref 0.5–1.9)
Lactic Acid, Venous: 0.8 mmol/L (ref 0.5–1.9)

## 2018-03-07 LAB — EXPECTORATED SPUTUM ASSESSMENT W REFEX TO RESP CULTURE

## 2018-03-07 MED ORDER — POLYETHYLENE GLYCOL 3350 17 G PO PACK
17.0000 g | PACK | Freq: Two times a day (BID) | ORAL | Status: DC
  Administered 2018-03-07 – 2018-03-08 (×2): 17 g via ORAL
  Filled 2018-03-07 (×4): qty 1

## 2018-03-07 MED ORDER — ORAL CARE MOUTH RINSE
15.0000 mL | Freq: Two times a day (BID) | OROMUCOSAL | Status: DC
  Administered 2018-03-07 – 2018-03-09 (×5): 15 mL via OROMUCOSAL

## 2018-03-07 MED ORDER — PANTOPRAZOLE SODIUM 40 MG PO TBEC
40.0000 mg | DELAYED_RELEASE_TABLET | Freq: Two times a day (BID) | ORAL | Status: DC
  Administered 2018-03-07 – 2018-03-09 (×5): 40 mg via ORAL
  Filled 2018-03-07 (×5): qty 1

## 2018-03-07 MED ORDER — ACETAMINOPHEN 325 MG PO TABS: 650.0000 mg | ORAL_TABLET | Freq: Four times a day (QID) | ORAL | Status: DC | PRN

## 2018-03-07 MED ORDER — SODIUM CHLORIDE 0.9 % IV SOLN
1.0000 g | Freq: Every day | INTRAVENOUS | Status: DC
  Administered 2018-03-07 – 2018-03-09 (×3): 1 g via INTRAVENOUS
  Filled 2018-03-07: qty 1
  Filled 2018-03-07: qty 10
  Filled 2018-03-07 (×2): qty 1
  Filled 2018-03-07: qty 10

## 2018-03-07 MED ORDER — ONDANSETRON HCL 4 MG PO TABS: 4.0000 mg | ORAL_TABLET | Freq: Four times a day (QID) | ORAL | Status: DC | PRN

## 2018-03-07 MED ORDER — ALBUMIN HUMAN 25 % IV SOLN
25.0000 g | Freq: Once | INTRAVENOUS | Status: AC
  Administered 2018-03-07: 25 g via INTRAVENOUS
  Filled 2018-03-07: qty 100

## 2018-03-07 MED ORDER — SODIUM CHLORIDE 0.9 % IV SOLN
INTRAVENOUS | Status: AC
  Administered 2018-03-07: 02:00:00 via INTRAVENOUS

## 2018-03-07 MED ORDER — CARVEDILOL 3.125 MG PO TABS
3.1250 mg | ORAL_TABLET | Freq: Two times a day (BID) | ORAL | Status: DC
  Administered 2018-03-07 – 2018-03-09 (×5): 3.125 mg via ORAL
  Filled 2018-03-07 (×5): qty 1

## 2018-03-07 MED ORDER — ONDANSETRON HCL 4 MG/2ML IJ SOLN
4.0000 mg | Freq: Four times a day (QID) | INTRAMUSCULAR | Status: DC | PRN
  Administered 2018-03-08 – 2018-03-09 (×2): 4 mg via INTRAVENOUS
  Filled 2018-03-07 (×2): qty 2

## 2018-03-07 MED ORDER — ACETAMINOPHEN 650 MG RE SUPP: 650.0000 mg | Freq: Four times a day (QID) | RECTAL | Status: DC | PRN

## 2018-03-07 MED ORDER — INSULIN ASPART 100 UNIT/ML ~~LOC~~ SOLN
0.0000 [IU] | SUBCUTANEOUS | Status: DC
  Administered 2018-03-07: 2 [IU] via SUBCUTANEOUS
  Administered 2018-03-07 – 2018-03-09 (×5): 1 [IU] via SUBCUTANEOUS

## 2018-03-07 MED ORDER — SODIUM CHLORIDE 0.9 % IV SOLN
500.0000 mg | Freq: Every day | INTRAVENOUS | Status: DC
  Administered 2018-03-07 – 2018-03-08 (×3): 500 mg via INTRAVENOUS
  Filled 2018-03-07 (×4): qty 500

## 2018-03-07 NOTE — Progress Notes (Signed)
PROGRESS NOTE    GLENWOOD REVOIR  HUT:[id number] DOB: [birth year] DOA: 03/06/2018 PCP: Cyndy Freeze, MD    Brief Narrative:  Johnny Durham is a 73 y.o. male with history of chronic systolic heart failure last EF was around 25 to 30% per care everywhere, diabetes mellitus type 2, CAD, cirrhosis who has had undergone paracentesis at least 3 times recently last one was actually yesterday at [facility] had come to the ER at [facility] for shortness of breath not feeling well for the last 1 week.  Has been exam productive cough.  Also had some nausea vomiting and noticed some blood-tinged vomitus.  During evaluation he was found to have multifocal pneumonia with possible GI bleed and was transferred to [facility] for higher level of care.  Assessment & Plan:   Principal Problem:   CAP (community acquired pneumonia) Active Problems:   ARF (acute renal failure) (HCC)   Cirrhosis ([place])   CAD (coronary artery disease)   Type 2 diabetes mellitus with vascular disease ([place])   Anemia due to chronic kidney disease   Chronic systolic CHF (congestive heart failure) (HCC)   Acute GI bleeding   Acute hypoxic respiratory failure Right upper lobe pneumonia, suspect gram-negative/atypical organism Patient initially presented to [facility] with progressive shortness of breath and weakness and associated fevers.  Flu panel was negative.  CT chest with moderate right pleural effusion with associated atelectasis versus consolidation and multifocal heterogeneous airspace opacities of the right lower and upper lobe; as well on the left lung base consistent with a multifocal infection.  --Afebrile --WBC count 11.1-->9.9 --Blood cultures x2 performed at OSH: No growth x1 day --Urine Legionella and strep pneumo antigen: Pending --Continue ceftriaxone and azithromycin --Continue to titrate supplemental oxygen to maintain SPO2 greater than 92%  Hypotension Patient presenting  as a transfer from [facility] with sepsis secondary to pneumonia as above.  Also has underlying cirrhosis underwent paracentesis on 03/06/2018 with 4.5 L removed.  Unclear if he received albumin.  On Spironolactone as an outpatient for fluid management, currently on hold. --Transfused 25 g IV albumin today --Holding spironolactone --Continue to monitor BP closely  Questionable GI bleed Patient was transferred from [facility] for possible gastrointestinal bleeding.  Notes indicate history of esophageal varices with endoscopy roughly August 2019.Jearld Shines with gastroenterology, Dr. Bjorn Loser at [facility]..  Currently patient denies any recent history of hemoptysis or blood in the stool. --Hemoglobin stable, 9.6 today --Discontinue Protonix drip; start PPI PO BID --Repeat hemoglobin in a.m., if no signs/symptoms of bleeding will defer any further GI work-up to his outpatient gastroenterologist.  Cirrhosis, unclear etiology Ascites Patient states follows with gastroenterology outpatient in [place].  Unclear etiology, denies any history of significant alcohol abuse.  Suspect NAFLD.  Undergoes large-volume paracentesis every 3 weeks.  Underwent paracentesis at [facility] on 03/06/2018 with 4.5 L removed.  Denies any abdominal discomfort, unlikely SBP. --MELD on admission: 27; Na138, Tbili 1.4, Cr 1.96, INR 1.72 --Ascitic cell count with few RBCs, 3 WBC, ascitic Gram stain with rare WBCs, ? epithelial cells --On spironolactone, Lasix outpatient, currently holding due to borderline hypotension --Continue to monitor daily CMP, INR  Acute renal failure Creatinine up to 1.96 today, was 2.4 on admission.  Etiology possibly related to hepatorenal syndrome versus sepsis/dehydration.  Creatinine noted to be 1.13 in November 2019. --Continue IV fluid hydration and IV antibiotics as above --IV albumin today --Continue to follow BMP daily  Essential hypertension Ischemic cardiomyopathy Chronic  systolic congestive heart failure -last EF per [facility] notes 25% Holding home lisinopril, Lasix, Spironolactone for borderline hypotension --Continue Coreg 3.125 mg p.o. twice daily with hold parameters  Diabetes mellitus type 2 Last hemoglobin A1c 11.3 in April 2016.  On metformin 500 mg p.o. twice daily at home. --Hold oral hypoglycemics while inpatient --Continue on insulin sliding scale   DVT prophylaxis: SCD's Code Status: FULL CODE Family Communication: Plan discussed with spouse today Disposition Plan: Physical therapy for evaluation of discharge needs, otherwise anticipate discharge home when medically stable   Consultants:   none  Procedures:   Paracentesis 03/06/2018; performed at [facility]  Antimicrobials:   Azithromycin  Ceftriaxone   Subjective:   Patient resting comfortably in bed, spouse present.  States breathing is slightly improved but continues with shortness of breath.  Continues on supplemental oxygen.  Has productive cough of yellow/whitish sputum.  No other complaints at this time.  Denies headache, no visual changes, no chest pain, palpitations, no fever/chills/night sweats, no nausea/vomiting/diarrhea, no abdominal pain.  No acute events reported overnight per nursing staff.  Objective: Vitals:   03/07/18 1000 03/07/18 1100 03/07/18 1200 03/07/18 1300  BP: (!) 115/58 (!) 104/50 (!) 103/42 (!) 108/51  Pulse: 75 76 72 73  Resp: (!) 26 (!) 21 (!) 26 (!) 24  Temp:   (!) 97.5 F (36.4 C)   TempSrc:   Oral   SpO2: 98% 96% 94% 96%  Weight:      Height:        Intake/Output Summary (Last 24 hours) at 03/07/2018 1354 Last data filed at 03/07/2018 1300 Gross per 24 hour  Intake 1337.07 ml  Output 950 ml  Net 387.07 ml   Filed Weights   03/06/18 2345  Weight: 63.9 kg    Examination:  General exam: Appears calm and comfortable  Respiratory system: Coarse breath sounds bilaterally with slight decreased bilateral bases, normal respiratory  effort, on 2 L nasal cannula. Cardiovascular system: S1 & S2 heard, RRR. No JVD, murmurs, rubs, gallops or clicks. No pedal edema. Gastrointestinal system: Abdomen is nondistended, soft and nontender. No organomegaly or masses felt. Normal bowel sounds heard. Central nervous system: Alert and oriented. No focal neurological deficits. Extremities: Symmetric 5 x 5 power. Skin: No rashes, lesions or ulcers Psychiatry: Judgement and insight appear normal. Mood & affect appropriate.     Data Reviewed: I have personally reviewed following labs and imaging studies  CBC: Recent Labs  Lab 03/07/18 0024 03/07/18 0249 03/07/18 0814  WBC 11.1* 9.9 9.1  HGB 10.3* 9.6* 9.4*  HCT 32.6* 30.2* 30.1*  MCV 91.3 89.9 90.9  PLT 140* 145* 510*   Basic Metabolic Panel: Recent Labs  Lab 03/07/18 0024 03/07/18 0249  NA 138 138  K 3.5 3.9  CL 114* 113*  CO2 18* 17*  GLUCOSE 148* 145*  BUN 76* 70*  CREATININE 1.83* 1.96*  CALCIUM 8.2* 8.1*   GFR: Estimated Creatinine Clearance: 31.7 mL/min (A) (by C-G formula based on SCr of 1.96 mg/dL (H)). Liver Function Tests: Recent Labs  Lab 03/07/18 0024  AST 24  ALT 10  ALKPHOS 53  BILITOT 1.4*  PROT 7.0  ALBUMIN 3.6   No results for input(s): LIPASE, AMYLASE in the last 168 hours. No results for input(s): AMMONIA in the last 168 hours. Coagulation Profile: Recent Labs  Lab 03/07/18 0024  INR 1.72   Cardiac Enzymes: Recent Labs  Lab 03/07/18 0814  TROPONINI 0.04*   BNP (last 3 results) No results for  input(s): PROBNP in the last 8760 hours. HbA1C: No results for input(s): HGBA1C in the last 72 hours. CBG: Recent Labs  Lab 03/07/18 0350 03/07/18 0814 03/07/18 1157  GLUCAP 133* 106* 153*   Lipid Profile: No results for input(s): CHOL, HDL, LDLCALC, TRIG, CHOLHDL, LDLDIRECT in the last 72 hours. Thyroid Function Tests: No results for input(s): TSH, T4TOTAL, FREET4, T3FREE, THYROIDAB in the last 72 hours. Anemia Panel: No  results for input(s): VITAMINB12, FOLATE, FERRITIN, TIBC, IRON, RETICCTPCT in the last 72 hours. Sepsis Labs: Recent Labs  Lab 03/07/18 0024 03/07/18 0814  LATICACIDVEN 1.2 0.8    Recent Results (from the past 240 hour(s))  MRSA PCR Screening     Status: None   Collection Time: 03/07/18 12:25 AM  Result Value Ref Range Status   MRSA by PCR NEGATIVE NEGATIVE Final    Comment:        The GeneXpert MRSA Assay (FDA approved for NASAL specimens only), is one component of a comprehensive MRSA colonization surveillance program. It is not intended to diagnose MRSA infection nor to guide or monitor treatment for MRSA infections. Performed at [facility], [place] [address], [place], [place] [address]   Culture, sputum-assessment     Status: None   Collection Time: 03/07/18  8:10 AM  Result Value Ref Range Status   Specimen Description SPUTUM  Final   Special Requests NONE  Final   Sputum evaluation   Final    THIS SPECIMEN IS ACCEPTABLE FOR SPUTUM CULTURE Performed at [facility], [place] [address] [place], [place] [address]    Report Status 03/07/2018 FINAL  Final  Culture, respiratory     Status: None (Preliminary result)   Collection Time: 03/07/18  8:10 AM  Result Value Ref Range Status   Specimen Description   Final    SPUTUM Performed at [place] [address], [place], [place] [address]    Special Requests   Final    NONE Reflexed from [id number] Performed at [facility], [place] [address] [place], [place] [address]    Gram Stain   Final    RARE WBC PRESENT, PREDOMINANTLY PMN RARE GRAM POSITIVE COCCI IN PAIRS RARE GRAM NEGATIVE RODS RARE GRAM POSITIVE RODS Performed at [facility], [place] [address] [place], [place] [address]    Culture PENDING  Incomplete   Report Status PENDING  Incomplete         Radiology Studies: No results found.      Scheduled Meds: . carvedilol   3.125 mg Oral BID WC  . insulin aspart  0-9 Units Subcutaneous Q4H  . mouth rinse  15 mL Mouth Rinse BID  . pantoprazole  40 mg Oral BID  . polyethylene glycol  17 g Oral BID   Continuous Infusions: . sodium chloride 75 mL/hr at 03/07/18 1300  . azithromycin Stopped (03/07/18 0452)  . cefTRIAXone (ROCEPHIN)  IV Stopped (03/07/18 0255)     LOS: 1 day    Time spent: 33 minutes    Kaylob Wallen J [place] DO [facility] Pager [phone]  If 7PM-7AM, please contact night-coverage [email or web address] Password [email or web address] 03/07/2018, 1:54 PM

## 2018-03-07 NOTE — H&P (Addendum)
History and Physical    Johnny Durham UVO:[id number] DOB: [birth year] DOA: 03/06/2018  PCP: Cyndy Freeze, MD  Patient coming from: Patient was transferred from [facility].  Chief Complaint: Shortness of breath.  HPI: Johnny Durham is a 73 y.o. male with history of chronic systolic heart failure last EF was around 25 to 30% per care everywhere, diabetes mellitus type 2, CAD, cirrhosis who has had undergone paracentesis at least 3 times recently last one was actually yesterday at [facility] had come to the ER at [facility] for shortness of breath not feeling well for the last 1 week.  Has been exam productive cough.  Also had some nausea vomiting and noticed some blood-tinged vomitus.  ED Course: In the ER at [facility] patient was mildly febrile tachycardic with hemoglobin around 10.9 which is a drop from 11.7 in November 2019 and the patient's creatinine has increased from 1.1 in November 20 19-2.4.  Patient was given 2 L fluid bolus.  CT scan of the chest shows moderate right pleural effusion with associated atelectasis or consolidation there is dense confluent multifocal heterogeneous airspace opacities of dependent right lower lobe and right upper lobe and some in the left lung base findings are considered with multifocal infection.  Patient was started on empiric antibiotics after blood cultures were obtained flu panel was negative.  Patient also had a distended abdomen consistent with ascites for which patient underwent paracentesis.  Since there is no gastroenterology available patient was transferred to [facility].  Patient also was started on Protonix infusion.  On exam patient not in distress.  Review of Systems: As per HPI, rest all negative.   Past Medical History:  Diagnosis Date  . Chronic systolic congestive heart failure, NYHA class 2 ([place])   . Coronary artery disease   . Hypercholesteremia   . Hypertensive heart disease with CHF ([place])    . Lacunar infarct, acute ([place])   . Sequela of cerebrovascular accident   . Uncontrolled type 2 diabetes mellitus with hyperglycemia, with long-term current use of insulin [facility])     Past Surgical History:  Procedure Laterality Date  . BACK SURGERY  1997  . CORONARY ARTERY BYPASS GRAFT    . NECK SURGERY  2000     reports that he has quit smoking. He has never used smokeless tobacco. He reports that he does not drink alcohol or use drugs.  Allergies  Allergen Reactions  . Torsemide Diarrhea    Family History  Problem Relation Age of Onset  . Stomach cancer Mother   . Diabetes Mother   . Hypertension Mother   . Stomach cancer Father   . CVA Sister   . Colon polyps Brother   . Diabetes Brother   . Hypertension Brother   . Heart attack Paternal Grandmother   . Hypercholesterolemia Paternal Grandmother   . Heart attack Paternal Grandfather   . Hypercholesterolemia Paternal Grandfather   . Heart attack Paternal Uncle   . Hypercholesterolemia Paternal Uncle   . Diabetes Paternal Aunt     Prior to Admission medications   Medication Sig Start Date End Date Taking? Authorizing Provider  carvedilol (COREG) 3.125 MG tablet Take 3.125 mg by mouth 2 (two) times daily with a meal.   Yes [provider]  furosemide (LASIX) 40 MG tablet Take 40 mg by mouth daily.    Yes [provider]  lisinopril (PRINIVIL,ZESTRIL) 20 MG tablet Take 20 mg by mouth daily.   Yes [provider]  metFORMIN (GLUCOPHAGE) 500 MG tablet Take 500 mg by mouth 2 (two) times daily with a meal.    Yes [provider]  Omega-3 1000 MG CAPS Take 1 g by mouth daily.   Yes [provider]  spironolactone (ALDACTONE) 100 MG tablet Take 100 mg by mouth daily. 01/17/18  Yes [provider]    Physical Exam: There were no vitals filed for this visit.    Constitutional: Moderately built and nourished. There were no vitals filed for this visit.  Blood pressure is  around 96/70.  Pulse is around 80/min.  Temperature is 98.4.  Respiration 18/min O2 sat is 97% on 2 L. Eyes: Anicteric no pallor. ENMT: No discharge from the ears eyes nose and mouth. Neck: No mass felt.  No neck rigidity. Respiratory: No rhonchi or crepitations. Cardiovascular: S1-S2 heard. Abdomen: Soft nontender bowel sounds present. Musculoskeletal: No edema.  No joint effusion. Skin: No rash. Neurologic: Alert awake oriented to time place and person.  Moves all extremities. Psychiatric: Appears normal.  Normal affect.   Labs on Admission: I have personally reviewed following labs and imaging studies  CBC: Recent Labs  Lab 03/07/18 0024  WBC 11.1*  HGB 10.3*  HCT 32.6*  MCV 91.3  PLT 962*   Basic Metabolic Panel: Recent Labs  Lab 03/07/18 0024  NA 138  K 3.5  CL 114*  CO2 18*  GLUCOSE 148*  BUN 76*  CREATININE 1.83*  CALCIUM 8.2*   GFR: CrCl cannot be calculated (Unknown ideal weight.). Liver Function Tests: Recent Labs  Lab 03/07/18 0024  AST 24  ALT 10  ALKPHOS 53  BILITOT 1.4*  PROT 7.0  ALBUMIN 3.6   No results for input(s): LIPASE, AMYLASE in the last 168 hours. No results for input(s): AMMONIA in the last 168 hours. Coagulation Profile: Recent Labs  Lab 03/07/18 0024  INR 1.72   Cardiac Enzymes: No results for input(s): CKTOTAL, CKMB, CKMBINDEX, TROPONINI in the last 168 hours. BNP (last 3 results) No results for input(s): PROBNP in the last 8760 hours. HbA1C: No results for input(s): HGBA1C in the last 72 hours. CBG: No results for input(s): GLUCAP in the last 168 hours. Lipid Profile: No results for input(s): CHOL, HDL, LDLCALC, TRIG, CHOLHDL, LDLDIRECT in the last 72 hours. Thyroid Function Tests: No results for input(s): TSH, T4TOTAL, FREET4, T3FREE, THYROIDAB in the last 72 hours. Anemia Panel: No results for input(s): VITAMINB12, FOLATE, FERRITIN, TIBC, IRON, RETICCTPCT in the last 72 hours. Urine analysis: No results found  for: COLORURINE, APPEARANCEUR, LABSPEC, PHURINE, GLUCOSEU, HGBUR, BILIRUBINUR, KETONESUR, PROTEINUR, UROBILINOGEN, NITRITE, LEUKOCYTESUR Sepsis Labs: @LABRCNTIP (procalcitonin:4,lacticidven:4) )No results found for this or any previous visit (from the past 240 hour(s)).   Radiological Exams on Admission: No results found.  EKG: Independently reviewed.  A. fib rate controlled.  Assessment/Plan Principal Problem:   CAP (community acquired pneumonia) Active Problems:   ARF (acute renal failure) (HCC)   Cirrhosis (HCC)   CAD (coronary artery disease)   Type 2 diabetes mellitus with vascular disease ([place])   Anemia due to chronic kidney disease   Chronic systolic CHF (congestive heart failure) (HCC)   Acute GI bleeding    1. Community-acquired pneumonia with possible developing sepsis -patient had blood cultures drawn at [facility].  Presently on empiric antibiotics.  Continue to monitor closely. 2. GI bleed -per the report patient had some hematemesis at [facility] ER.  Stool for occult blood was positive with there.  Was started on Protonix  infusion.  Consult gastroenterology in the morning.  Not sure if patient had an EGD previously. 3. Acute renal failure -could be hepatorenal syndrome.  Per the note patient received 2 L fluid at [facility] also received albumin infusion.  Presently albumin is normal range.  Will gently hydrate note that patient has congestive heart failure closely monitor respiratory status.  Will hold off patient's lisinopril spironolactone and Lasix for now. 4. Chronic systolic and diastolic heart failure pretension last EF measured as per the chart notes are 25 to 30%.  Due to renal failure patient is receiving fluids.  Closely monitor respiratory status. 5. CAD denies any chest pain.  However since patient is having low normal blood pressure will check troponin. 6. Diabetes mellitus type 2 we will keep patient on sliding scale  coverage. 7. Cirrhosis of the liver usually take spironolactone and Lasix.  Presently on hold due to renal failure and possible developing sepsis. 8. Acute blood loss anemia follow CBC. 9. History of A. fib not on warfarin INR is 1.4.  Presently warfarin be on hold due to possible GI bleed.   DVT prophylaxis: SCDs. Code Status: Full code. Family Communication: Discussed with patient. Disposition Plan: Home. Consults called: None. Admission status: Inpatient.   Rise Patience MD [facility] Pager [phone].  If 7PM-7AM, please contact night-coverage [email or web address] Password [email or web address]  03/07/2018, 1:11 AM

## 2018-03-07 NOTE — Progress Notes (Signed)
Dr. Lara Mulch notified that patient was tested for flu in [place] were negative.  Blood Cultures were also drawn there.  Dr. Lara Mulch dc'd blood cultures here.  Ok to administer antibiotics now.  Lab notified

## 2018-03-07 NOTE — Progress Notes (Signed)
CRITICAL VALUE ALERT  Critical Value: Troponin 0.04  Date & Time Notied:  03/07/2018 3692  Provider Notified: E. [place]   Orders Received/Actions taken: None

## 2018-03-07 NOTE — Progress Notes (Signed)
   03/07/18 0936  Clinical Encounter Type  Visited With Patient and family together  Visit Type Initial;Spiritual support  Referral From Nurse  Consult/Referral To Chaplain  The chaplain responded to Pt. consult for prayer.  The chaplain checked in with Pt. RN-Hayley M. The chaplain was welcomed into the Pt. Room. The Pt. was later joined by his family.  The Pt. easily shared his faith with the chaplain which includes a strong relationship with God.  The Pt. and family prayed with the chaplain.  The Pt. family has questions about today's medical care path.  The Chaplain encouraged conversation with the Pt. RN.  The chaplain offered F/U spiritual care as needed.

## 2018-03-07 NOTE — Progress Notes (Signed)
Pt arrived via [facility] to 1238.  Alert, oriented.  No family with pt at this time.  Admitting physician notified or patient's arrival.

## 2018-03-08 ENCOUNTER — Inpatient Hospital Stay (HOSPITAL_COMMUNITY): Payer: Medicare Other

## 2018-03-08 LAB — COMPREHENSIVE METABOLIC PANEL
ALT: 9 U/L (ref 0–44)
AST: 16 U/L (ref 15–41)
Albumin: 3.4 g/dL — ABNORMAL LOW (ref 3.5–5.0)
Alkaline Phosphatase: 46 U/L (ref 38–126)
Anion gap: 7 (ref 5–15)
BUN: 57 mg/dL — ABNORMAL HIGH (ref 8–23)
CO2: 17 mmol/L — AB (ref 22–32)
Calcium: 8.1 mg/dL — ABNORMAL LOW (ref 8.9–10.3)
Chloride: 115 mmol/L — ABNORMAL HIGH (ref 98–111)
Creatinine, Ser: 1.54 mg/dL — ABNORMAL HIGH (ref 0.61–1.24)
GFR calc Af Amer: 52 mL/min — ABNORMAL LOW (ref >60)
GFR calc non Af Amer: 45 mL/min — ABNORMAL LOW (ref >60)
Glucose, Bld: 93 mg/dL (ref 70–99)
Potassium: 3.2 mmol/L — ABNORMAL LOW (ref 3.5–5.1)
Sodium: 139 mmol/L (ref 135–145)
Total Bilirubin: 1.3 mg/dL — ABNORMAL HIGH (ref 0.3–1.2)
Total Protein: 6.4 g/dL — ABNORMAL LOW (ref 6.5–8.1)

## 2018-03-08 LAB — GLUCOSE, CAPILLARY
GLUCOSE-CAPILLARY: 154 mg/dL — AB (ref 70–99)
GLUCOSE-CAPILLARY: 133 mg/dL — AB (ref 70–99)
Glucose-Capillary: 113 mg/dL — ABNORMAL HIGH (ref 70–99)
Glucose-Capillary: 121 mg/dL — ABNORMAL HIGH (ref 70–99)
Glucose-Capillary: 77 mg/dL (ref 70–99)
Glucose-Capillary: 82 mg/dL (ref 70–99)
Glucose-Capillary: 88 mg/dL (ref 70–99)
Glucose-Capillary: 88 mg/dL (ref 70–99)

## 2018-03-08 LAB — CBC
HCT: 30.4 % — ABNORMAL LOW (ref 39.0–52.0)
Hemoglobin: 9.4 g/dL — ABNORMAL LOW (ref 13.0–17.0)
MCH: 27.9 pg (ref 26.0–34.0)
MCHC: 30.9 g/dL (ref 30.0–36.0)
MCV: 90.2 fL (ref 80.0–100.0)
Platelets: 150 10*3/uL (ref 150–400)
RBC: 3.37 MIL/uL — ABNORMAL LOW (ref 4.22–5.81)
RDW: 15.3 % (ref 11.5–15.5)
WBC: 7.5 10*3/uL (ref 4.0–10.5)
nRBC: 0 % (ref 0.0–0.2)

## 2018-03-08 LAB — PROTIME-INR
INR: 1.41
Prothrombin Time: 17.1 seconds — ABNORMAL HIGH (ref 11.4–15.2)

## 2018-03-08 MED ORDER — POTASSIUM CHLORIDE CRYS ER 20 MEQ PO TBCR
40.0000 meq | EXTENDED_RELEASE_TABLET | Freq: Once | ORAL | Status: AC
  Administered 2018-03-08: 40 meq via ORAL
  Filled 2018-03-08: qty 2

## 2018-03-08 NOTE — Progress Notes (Signed)
PROGRESS NOTE    Johnny Durham  GGE:[id number] DOB: 03-16-1945 DOA: 03/06/2018 PCP: Cyndy Freeze, MD    Brief Narrative:  73 y.o.malewithhistory of chronic systolic heart failure last EF was around 25 to 30% per care everywhere, diabetes mellitus type 2, CAD, cirrhosis who has had undergone paracentesis at least 3 times recently last one was actually yesterday at [facility] had come to the ER at [facility] for shortness of breath not feeling well for the last 1 week. Has been exam productive cough. Also had some nausea vomiting and noticed some blood-tinged vomitus.  During evaluation he was found to have multifocal pneumonia with possible GI bleed and was transferred to [facility] for higher level of care.  Assessment & Plan:   Principal Problem:   CAP (community acquired pneumonia) Active Problems:   ARF (acute renal failure) (HCC)   Cirrhosis ([place])   CAD (coronary artery disease)   Type 2 diabetes mellitus with vascular disease ([place])   Anemia due to chronic kidney disease   Chronic systolic CHF (congestive heart failure) (HCC)   Acute GI bleeding  Acute hypoxic respiratory failure Right upper lobe pneumonia, suspect gram-negative/atypical organism Patient initially presented to [facility] with progressive shortness of breath and weakness and associated fevers.  Flu panel was negative.  CT chest with moderate right pleural effusion with associated atelectasis versus consolidation and multifocal heterogeneous airspace opacities of the right lower and upper lobe; as well on the left lung base consistent with a multifocal infection.  --Afebrile --WBC count 11.1-->9.9 --Blood cultures x2 performed at OSH: No growth x1 day --Urine Legionella and strep pneumo antigen: Pending --Continue ceftriaxone and azithromycin as tolerated --Respiratory status improved  Hypotension Patient presenting as a transfer from [facility] with sepsis  secondary to pneumonia as above.  Also has underlying cirrhosis underwent paracentesis on 03/06/2018 with 4.5 L removed.  Unclear if he received albumin.  On Spironolactone as an outpatient for fluid management, currently on hold. --Transfused 25 g IV albumin 1/31 --Holding spironolactone --BP imroved  Questionable GI bleed Patient was transferred from [facility] for possible gastrointestinal bleeding.  Notes indicate history of esophageal varices with endoscopy roughly August 2019.Jearld Shines with gastroenterology, Dr. Bjorn Loser at [facility]  Currently patient denies any recent history of hemoptysis or blood in the stool. --Hemoglobin stable, 9.4 currently --Discontinue Protonix drip; start PPI PO BID --Recommend further GI work-up to his outpatient gastroenterologist.  Cirrhosis, unclear etiology Ascites Patient states follows with gastroenterology outpatient in [place].  Unclear etiology, denies any history of significant alcohol abuse.  Suspect NAFLD.  Undergoes large-volume paracentesis every 3 weeks.  Underwent paracentesis at [facility] on 03/06/2018 with 4.5 L removed.  Denies any abdominal discomfort, unlikely SBP. --MELD on admission: 27; Na138, Tbili 1.4, Cr 1.96, INR 1.72 --Ascitic cell count with few RBCs, 3 WBC, ascitic Gram stain with rare WBCs, ? epithelial cells --On spironolactone, Lasix outpatient, currently holding due to borderline hypotension  Acute renal failure Creatinine up to 1.96 today, was 2.4 on admission.  Etiology possibly related to hepatorenal syndrome versus sepsis/dehydration.  Creatinine noted to be 1.13 in November 2019. --Improve with IV fluid hydration and IV antibiotics as above --IV albumin 1/31 --Continue to follow BMP daily  Essential hypertension Ischemic cardiomyopathy Chronic systolic congestive heart failure -last EF per [facility] notes 25% Holding home lisinopril, Lasix, Spironolactone for borderline hypotension --Continue Coreg 3.125 mg  p.o. twice daily with hold parameters - Stable at present  Diabetes mellitus type  2 Last hemoglobin A1c 11.3 in April 2016.  On metformin 500 mg p.o. twice daily at home. --Hold oral hypoglycemics while inpatient --Continue on insulin sliding scale   DVT prophylaxis: SCD's Code Status: Full Family Communication: Pt in room, family not at bedside Disposition Plan: Uncertain at this time  Consultants:     Procedures:     Antimicrobials: Anti-infectives (From admission, onward)   Start     Dose/Rate Route Frequency Ordered Stop   03/07/18 0130  cefTRIAXone (ROCEPHIN) 1 g in sodium chloride 0.9 % 100 mL IVPB     1 g 200 mL/hr over 30 Minutes Intravenous Daily at bedtime 03/07/18 0112 03/13/18 2159   03/07/18 0130  azithromycin (ZITHROMAX) 500 mg in sodium chloride 0.9 % 250 mL IVPB     500 mg 250 mL/hr over 60 Minutes Intravenous Daily at bedtime 03/07/18 0112 03/13/18 2159       Subjective: Eager to go home  Objective: Vitals:   03/08/18 0740 03/08/18 0800 03/08/18 0805 03/08/18 1100  BP: (!) 94/50  118/73   Pulse: 62  73   Resp: 20  18   Temp:  97.9 F (36.6 C)  97.7 F (36.5 C)  TempSrc:  Oral  Oral  SpO2: 91%  93%   Weight:      Height:        Intake/Output Summary (Last 24 hours) at 03/08/2018 1418 Last data filed at 03/08/2018 1200 Gross per 24 hour  Intake 240 ml  Output 800 ml  Net -560 ml   Filed Weights   03/06/18 2345 03/08/18 0500  Weight: 63.9 kg 64.3 kg    Examination:  General exam: Appears calm and comfortable  Respiratory system: Clear to auscultation. Respiratory effort normal. Cardiovascular system: S1 & S2 heard, RRR Gastrointestinal system: Abdomen is nondistended, soft and nontender. No organomegaly or masses felt. Normal bowel sounds heard. Central nervous system: Alert and oriented. No focal neurological deficits. Extremities: Symmetric 5 x 5 power. Skin: No rashes, lesions  Psychiatry: Judgement and insight appear normal.  Mood & affect appropriate.   Data Reviewed: I have personally reviewed following labs and imaging studies  CBC: Recent Labs  Lab 03/07/18 0024 03/07/18 0249 03/07/18 0814 03/08/18 0318  WBC 11.1* 9.9 9.1 7.5  HGB 10.3* 9.6* 9.4* 9.4*  HCT 32.6* 30.2* 30.1* 30.4*  MCV 91.3 89.9 90.9 90.2  PLT 140* 145* 140* 510   Basic Metabolic Panel: Recent Labs  Lab 03/07/18 0024 03/07/18 0249 03/08/18 0318  NA 138 138 139  K 3.5 3.9 3.2*  CL 114* 113* 115*  CO2 18* 17* 17*  GLUCOSE 148* 145* 93  BUN 76* 70* 57*  CREATININE 1.83* 1.96* 1.54*  CALCIUM 8.2* 8.1* 8.1*   GFR: Estimated Creatinine Clearance: 40.6 mL/min (A) (by C-G formula based on SCr of 1.54 mg/dL (H)). Liver Function Tests: Recent Labs  Lab 03/07/18 0024 03/08/18 0318  AST 24 16  ALT 10 9  ALKPHOS 53 46  BILITOT 1.4* 1.3*  PROT 7.0 6.4*  ALBUMIN 3.6 3.4*   No results for input(s): LIPASE, AMYLASE in the last 168 hours. No results for input(s): AMMONIA in the last 168 hours. Coagulation Profile: Recent Labs  Lab 03/07/18 0024 03/08/18 0318  INR 1.72 1.41   Cardiac Enzymes: Recent Labs  Lab 03/07/18 0814  TROPONINI 0.04*   BNP (last 3 results) No results for input(s): PROBNP in the last 8760 hours. HbA1C: No results for input(s): HGBA1C in the last 72 hours. CBG: Recent  Labs  Lab 03/07/18 1938 03/08/18 0006 03/08/18 0445 03/08/18 0804 03/08/18 1208  GLUCAP 123* 88 82 77 121*   Lipid Profile: No results for input(s): CHOL, HDL, LDLCALC, TRIG, CHOLHDL, LDLDIRECT in the last 72 hours. Thyroid Function Tests: No results for input(s): TSH, T4TOTAL, FREET4, T3FREE, THYROIDAB in the last 72 hours. Anemia Panel: No results for input(s): VITAMINB12, FOLATE, FERRITIN, TIBC, IRON, RETICCTPCT in the last 72 hours. Sepsis Labs: Recent Labs  Lab 03/07/18 0024 03/07/18 0814  LATICACIDVEN 1.2 0.8    Recent Results (from the past 240 hour(s))  MRSA PCR Screening     Status: None   Collection  Time: 03/07/18 12:25 AM  Result Value Ref Range Status   MRSA by PCR NEGATIVE NEGATIVE Final    Comment:        The GeneXpert MRSA Assay (FDA approved for NASAL specimens only), is one component of a comprehensive MRSA colonization surveillance program. It is not intended to diagnose MRSA infection nor to guide or monitor treatment for MRSA infections. Performed at [facility], [place] [address], [place], [place] [address]   Culture, sputum-assessment     Status: None   Collection Time: 03/07/18  8:10 AM  Result Value Ref Range Status   Specimen Description SPUTUM  Final   Special Requests NONE  Final   Sputum evaluation   Final    THIS SPECIMEN IS ACCEPTABLE FOR SPUTUM CULTURE Performed at [facility], [place] [address], [place], [place] [address]    Report Status 03/07/2018 FINAL  Final  Culture, respiratory     Status: None (Preliminary result)   Collection Time: 03/07/18  8:10 AM  Result Value Ref Range Status   Specimen Description   Final    SPUTUM Performed at [place] [address], [place], [place] [address]    Special Requests   Final    NONE Reflexed from [id number] Performed at [facility], [place] [address] [place], [place] [address]    Gram Stain   Final    RARE WBC PRESENT, PREDOMINANTLY PMN RARE GRAM POSITIVE COCCI IN PAIRS RARE GRAM NEGATIVE RODS RARE GRAM POSITIVE RODS    Culture   Final    CULTURE REINCUBATED FOR BETTER GROWTH Performed at [facility], [place] [address], [place], [place] [address]    Report Status PENDING  Incomplete     Radiology Studies: Dg Chest Port 1 View  Result Date: 03/08/2018 CLINICAL DATA:  Shortness of breath. EXAM: PORTABLE CHEST 1 VIEW COMPARISON:  None. FINDINGS: The heart is enlarged. Patient is status post CABG. BILATERAL pulmonary opacities, RIGHT greater than LEFT, with small RIGHT effusion as well as fluid in the minor fissure, all  consistent with pulmonary edema. No osseous findings of significance. IMPRESSION: Cardiomegaly with pulmonary edema. Electronically Signed   By: Staci Righter M.D.   On: 03/08/2018 06:50    Scheduled Meds: . carvedilol  3.125 mg Oral BID WC  . insulin aspart  0-9 Units Subcutaneous Q4H  . mouth rinse  15 mL Mouth Rinse BID  . pantoprazole  40 mg Oral BID  . polyethylene glycol  17 g Oral BID   Continuous Infusions: . azithromycin Stopped (03/07/18 2328)  . cefTRIAXone (ROCEPHIN)  IV Stopped (03/07/18 2215)     LOS: 2 days   Marylu Lund, MD [facility] Pager On Amion  If 7PM-7AM, please contact night-coverage 03/08/2018, 2:18 PM

## 2018-03-08 NOTE — Treatment Plan (Signed)
Met with patient and wife at bedside. Pt reports feeling better since initial presentation, breathing better. Patient's wife concerned that patient remains weaker than baseline, worried that she may not be able to fully care for him at home in this state. Will recommend PT/OT eval. Pt is agreeable to home health vs SNF pending recommendations. Pt otherwise medically stable. Will transfer to med-tele for continued care.

## 2018-03-09 DIAGNOSIS — R0602 Shortness of breath: Secondary | ICD-10-CM

## 2018-03-09 LAB — GLUCOSE, CAPILLARY
Glucose-Capillary: 80 mg/dL (ref 70–99)
Glucose-Capillary: 146 mg/dL — ABNORMAL HIGH (ref 70–99)
Glucose-Capillary: 142 mg/dL — ABNORMAL HIGH (ref 70–99)

## 2018-03-09 LAB — CULTURE, RESPIRATORY W GRAM STAIN: Culture: NORMAL

## 2018-03-09 LAB — LEGIONELLA PNEUMOPHILA SEROGP 1 UR AG: L. pneumophila Serogp 1 Ur Ag: NEGATIVE

## 2018-03-09 MED ORDER — CEFDINIR 300 MG PO CAPS
300.0000 mg | ORAL_CAPSULE | Freq: Two times a day (BID) | ORAL | Status: DC
  Administered 2018-03-09: 300 mg via ORAL
  Filled 2018-03-09: qty 1

## 2018-03-09 MED ORDER — AZITHROMYCIN 250 MG PO TABS
250.0000 mg | ORAL_TABLET | Freq: Once | ORAL | Status: AC
  Administered 2018-03-09: 250 mg via ORAL
  Filled 2018-03-09: qty 1

## 2018-03-09 MED ORDER — CEFDINIR 300 MG PO CAPS: 300.0000 mg | ORAL_CAPSULE | Freq: Two times a day (BID) | ORAL | 0 refills | Status: AC

## 2018-03-09 MED ORDER — METFORMIN HCL 500 MG PO TABS: 500.0000 mg | ORAL_TABLET | Freq: Two times a day (BID) | ORAL | 0 refills | Status: AC

## 2018-03-09 MED ORDER — AZITHROMYCIN 250 MG PO TABS: ORAL_TABLET | ORAL | 0 refills | Status: AC

## 2018-03-09 NOTE — Progress Notes (Signed)
Patient discharged home.  IV removed-WNL.  Reviewed AVS and medications with patient and wife.  Instructed to follow up with PCP in 1 week.  No questions at this time, verbalizes understanding.  No questions at this time, assisted off unit in NAD.

## 2018-03-09 NOTE — Progress Notes (Signed)
Called to pt's room to find him leaning over railing with a large amount of emesis on bed and floor. Pt stated that nausea came upon him suddenly. Pt bathed and antiemetic given. No blood seen in emesis. Pt feels much relieved after episode of vomiting and medication. No further complaints. Will continue to monitor.

## 2018-03-09 NOTE — Discharge Summary (Signed)
Physician Discharge Summary  NAIF ALABI WRU:[id number] DOB: [birth year] DOA: 03/06/2018  PCP: Cyndy Freeze, MD  Admit date: 03/06/2018 Discharge date: 03/09/2018  Admitted From: Home Disposition:  Home  Recommendations for Outpatient Follow-up:  1. Follow up with PCP in 1-2 weeks 2. Follow up with gastroenterologist as scheduled 3. Recommend comprehensive metabolic panel within one week  Discharge Condition:Improved CODE STATUS:Full Diet recommendation: diabetic, low sodium   Brief/Interim Summary: 73 y.o.malewithhistory of chronic systolic heart failure last EF was around 25 to 30% per care everywhere, diabetes mellitus type 2, CAD, cirrhosis who has had undergone paracentesis at least 3 times recently last one was actually yesterday at [facility] had come to the ER at [facility] for shortness of breath not feeling well for the last 1 week. Has been exam productive cough. Also had some nausea vomiting and noticed some blood-tinged vomitus.During evaluation he was found to have multifocal pneumonia with possible GI bleed and was transferred to [facility] for higher level of care.  Discharge Diagnoses:  Principal Problem:   CAP (community acquired pneumonia) Active Problems:   ARF (acute renal failure) (HCC)   Cirrhosis ([place])   CAD (coronary artery disease)   Type 2 diabetes mellitus with vascular disease ([place])   Anemia due to chronic kidney disease   Chronic systolic CHF (congestive heart failure) (HCC)   Acute GI bleeding  Acute hypoxic respiratory failure Right upper lobe pneumonia, suspect gram-negative/atypical organism Patient initially presented to [facility] with progressive shortness of breath and weakness and associated fevers. Flu panel was negative. CT chest with moderate right pleural effusion with associated atelectasis versus consolidation and multifocal heterogeneous airspace opacities of the right lower and upper lobe;as  well on the left lung base consistent with a multifocal infection. --Remained afebrile --WBC normalized --Blood cultures x2 performed at OSH --Urine Legionella and strep pneumo antigen: both neg --Continued ceftriaxone and azithromycin as tolerated, complete course of omnicef and azithromycin on discharge --Respiratory status improved, ambulated in hallway on room air  Hypotension Patient presenting as a transfer from [facility] with sepsis secondary to pneumonia as above. Also has underlying cirrhosis underwent paracentesis on 03/06/2018 with 4.5 L removed. Unclear if he received albumin. On Spironolactone as an outpatient for fluid management, currently on hold. --Transfused 25 g IV albumin 1/31 --Held spironolactone while in hospital --BP imroved  Questionable GI bleed Patient was transferred from [facility] for possible gastrointestinal bleeding. Notes indicate history of esophageal varices with endoscopy roughly August 2019.Jearld Shines with gastroenterology, Dr. Bjorn Loser at [facility].. Currently patient denies any recent history of hemoptysis or blood in the stool. --Hemoglobin stable, 9.4 currently --Discontinue Protonix drip;start PPIPO BID --Recommend further GI work-up to his outpatient gastroenterologist.  Cirrhosis, unclear etiology Ascites Patient states follows with gastroenterology outpatient in [place]. Unclear etiology, denies any history of significant alcohol abuse. Suspect NAFLD.Undergoes large-volume paracentesis every 3 weeks. Underwent paracentesis at [facility] on 03/06/2018 with 4.5 L removed. Denies any abdominal discomfort, unlikely SBP. --MELD on admission: 27; Na138, Tbili 1.4, Cr 1.96, INR 1.72 --Ascitic cell count with few RBCs, 3WBC,ascitic Gram stain with rare WBCs,?epithelial cells --On spironolactone,Lasix outpatient, currently had held to borderline hypotension  Acute renal failure Creatinine up to 1.96 today, was 2.4 on  admission. Etiology possibly related to hepatorenal syndrome versus sepsis/dehydration. Creatinine noted to be 1.13 in November 2019. --Improve with IV fluid hydration and IV antibiotics as above --IValbumin 1/31 -Held ACEI  Essential hypertension Ischemic cardiomyopathy Chronic systolic congestive heart failure-[facility] EF  [facility] 25% Holding home lisinopril, Lasix, Spironolactone for borderline hypotension --Continue Coreg 3.125 mg p.o. twice daily with hold parameters - Stable at present  Diabetes mellitus type 2 Last hemoglobin A1c 11.3 in April 2016. On metformin 500 mg p.o. twice daily at home. --Hold oral hypoglycemics while inpatient --Continue on insulin sliding scale   Discharge Instructions   Allergies as of 03/09/2018      Reactions   Torsemide Diarrhea      Medication List    STOP taking these medications   lisinopril 20 MG tablet Commonly known as:  PRINIVIL,ZESTRIL     TAKE these medications   azithromycin 250 MG tablet Commonly known as:  ZITHROMAX 1 tab po daily x 4 more days, then stop   carvedilol 3.125 MG tablet Commonly known as:  COREG Take 3.125 mg by mouth 2 (two) times daily with a meal.   cefdinir 300 MG capsule Commonly known as:  OMNICEF Take 1 capsule (300 mg total) by mouth 2 (two) times daily for 4 days.   furosemide 40 MG tablet Commonly known as:  LASIX Take 40 mg by mouth daily.   metFORMIN 500 MG tablet Commonly known as:  GLUCOPHAGE Take 500 mg by mouth 2 (two) times daily with a meal.   Omega-3 1000 MG Caps Take 1 g by mouth daily.   spironolactone 100 MG tablet Commonly known as:  ALDACTONE Take 100 mg by mouth daily.       Allergies  Allergen Reactions  . Torsemide Diarrhea     Procedures/Studies: Dg Chest Port 1 View  Result Date: 03/08/2018 CLINICAL DATA:  Shortness of breath. EXAM: PORTABLE CHEST 1 VIEW COMPARISON:  None. FINDINGS: The heart is enlarged. Patient is status post CABG. BILATERAL  pulmonary opacities, RIGHT greater than LEFT, with small RIGHT effusion as well as fluid in the minor fissure, all consistent with pulmonary edema. No osseous findings of significance. IMPRESSION: Cardiomegaly with pulmonary edema. Electronically Signed   By: Staci Righter M.D.   On: 03/08/2018 06:50    Subjective: Eager to go home  Discharge Exam: Vitals:   03/09/18 0604 03/09/18 1348  BP: (!) 102/59 96/62  Pulse: (!) 57 (!) 57  Resp: 15   Temp: 98.1 F (36.7 C) (!) 97.3 F (36.3 C)  SpO2: 93% 97%   Vitals:   03/08/18 2030 03/09/18 0500 03/09/18 0604 03/09/18 1348  BP: 104/66  (!) 102/59 96/62  Pulse: 63  (!) 57 (!) 57  Resp: 19  15   Temp: 98 F (36.7 C)  98.1 F (36.7 C) (!) 97.3 F (36.3 C)  TempSrc: Oral  Oral Oral  SpO2: 94%  93% 97%  Weight:  69.8 kg    Height:        General: Pt is alert, awake, not in acute distress Cardiovascular: RRR, S1/S2 +, no rubs, no gallops Respiratory: CTA bilaterally, no wheezing, no rhonchi Abdominal: Soft, NT, ND, bowel sounds + Extremities: no edema, no cyanosis   The results of significant diagnostics from this hospitalization (including imaging, microbiology, ancillary and laboratory) are listed below for reference.     Microbiology: Recent Results (from the past 240 hour(s))  MRSA PCR Screening     Status: None   Collection Time: 03/07/18 12:25 AM  Result Value Ref Range Status   MRSA by PCR NEGATIVE NEGATIVE Final    Comment:        The GeneXpert MRSA Assay (FDA approved for NASAL specimens only), is one component of a  comprehensive MRSA colonization surveillance program. It is not intended to diagnose MRSA infection nor to guide or monitor treatment for MRSA infections. Performed at [facility], [place] [address], [place], [place] [address]   Culture, sputum-assessment     Status: None   Collection Time: 03/07/18  8:10 AM  Result Value Ref Range Status   Specimen Description SPUTUM  Final    Special Requests NONE  Final   Sputum evaluation   Final    THIS SPECIMEN IS ACCEPTABLE FOR SPUTUM CULTURE Performed at [facility], [place] [address], [place], [place] [address]    Report Status 03/07/2018 FINAL  Final  Culture, respiratory     Status: None (Preliminary result)   Collection Time: 03/07/18  8:10 AM  Result Value Ref Range Status   Specimen Description   Final    SPUTUM Performed at [facility] [address], [place], [place] [address]    Special Requests   Final    NONE Reflexed from [id number] Performed at [facility], [place] [address] [place] [address] [place], [place] [address]    Gram Stain   Final    RARE WBC PRESENT, PREDOMINANTLY PMN RARE GRAM POSITIVE COCCI IN PAIRS RARE GRAM NEGATIVE RODS RARE GRAM POSITIVE RODS    Culture   Final    CULTURE REINCUBATED FOR BETTER GROWTH Performed at [facility], [place] [address] [place],  [address]    Report Status PENDING  Incomplete     Labs: BNP (last 3 results) No results for input(s): BNP in the last 8760 hours. Basic Metabolic Panel: Recent Labs  Lab 03/07/18 0024 03/07/18 0249 03/08/18 0318  NA 138 138 139  K 3.5 3.9 3.2*  CL 114* 113* 115*  CO2 18* 17* 17*  GLUCOSE 148* 145* 93  BUN 76* 70* 57*  CREATININE 1.83* 1.96* 1.54*  CALCIUM 8.2* 8.1* 8.1*   Liver Function Tests: Recent Labs  Lab 03/07/18 0024 03/08/18 0318  AST 24 16  ALT 10 9  ALKPHOS 53 46  BILITOT 1.4* 1.3*  PROT 7.0 6.4*  ALBUMIN 3.6 3.4*   No results for input(s): LIPASE, AMYLASE in the last 168 hours. No results for input(s): AMMONIA in the last 168 hours. CBC: Recent Labs  Lab 03/07/18 0024 03/07/18 0249 03/07/18 0814 03/08/18 0318  WBC 11.1* 9.9 9.1 7.5  HGB 10.3* 9.6* 9.4* 9.4*  HCT 32.6* 30.2* 30.1* 30.4*  MCV 91.3 89.9 90.9 90.2  PLT 140* 145* 140* 150   Cardiac Enzymes: Recent Labs  Lab 03/07/18 0814  TROPONINI 0.04*   BNP: Invalid input(s):  POCBNP CBG: Recent Labs  Lab 03/08/18 2030 03/08/18 2218 03/08/18 2338 03/09/18 0742 03/09/18 1126  GLUCAP 154* 133* 113* 80 146*   D-Dimer No results for input(s): DDIMER in the last 72 hours. Hgb A1c No results for input(s): HGBA1C in the last 72 hours. Lipid Profile No results for input(s): CHOL, HDL, LDLCALC, TRIG, CHOLHDL, LDLDIRECT in the last 72 hours. Thyroid function studies No results for input(s): TSH, T4TOTAL, T3FREE, THYROIDAB in the last 72 hours.  Invalid input(s): FREET3 Anemia work up No results for input(s): VITAMINB12, FOLATE, FERRITIN, TIBC, IRON, RETICCTPCT in the last 72 hours. Urinalysis    Component Value Date/Time   COLORURINE YELLOW 03/07/2018 1132   APPEARANCEUR CLEAR 03/07/2018 1132   LABSPEC 1.011 03/07/2018 1132   PHURINE 6.0 03/07/2018 1132   GLUCOSEU NEGATIVE 03/07/2018 1132   HGBUR LARGE (A) 03/07/2018 1132   BILIRUBINUR  NEGATIVE 03/07/2018 [place] 03/07/2018 1132   PROTEINUR 30 (A) 03/07/2018 1132   NITRITE NEGATIVE 03/07/2018 1132   LEUKOCYTESUR MODERATE (A) 03/07/2018 1132   Sepsis Labs Invalid input(s): PROCALCITONIN,  WBC,  LACTICIDVEN Microbiology Recent Results (from the past 240 hour(s))  MRSA PCR Screening     Status: None   Collection Time: 03/07/18 12:25 AM  Result Value Ref Range Status   MRSA by PCR NEGATIVE NEGATIVE Final    Comment:        The GeneXpert MRSA Assay (FDA approved for NASAL specimens only), is one component of a comprehensive MRSA colonization surveillance program. It is not intended to diagnose MRSA infection nor to guide or monitor treatment for MRSA infections. Performed at [facility], [place] [address] [place], [place] [address]   Culture, sputum-assessment     Status: None   Collection Time: 03/07/18  8:10 AM  Result Value Ref Range Status   Specimen Description SPUTUM  Final   Special Requests NONE  Final   Sputum evaluation   Final    THIS SPECIMEN  IS ACCEPTABLE FOR SPUTUM CULTURE Performed at [facility], [place] [address], [place], [place] [address]    Report Status 03/07/2018 FINAL  Final  Culture, respiratory     Status: None (Preliminary result)   Collection Time: 03/07/18  8:10 AM  Result Value Ref Range Status   Specimen Description   Final    SPUTUM Performed at [place] [address] [place], [place] [address]    Special Requests   Final    NONE Reflexed from [id number] Performed at [facility], [place] [address], [place], [place] [address]    Gram Stain   Final    RARE WBC PRESENT, PREDOMINANTLY PMN RARE GRAM POSITIVE COCCI IN PAIRS RARE GRAM NEGATIVE RODS RARE GRAM POSITIVE RODS    Culture   Final    CULTURE REINCUBATED FOR BETTER GROWTH Performed at [facility], [place] [address], [place],  [address]    Report Status PENDING  Incomplete   Time spent: 30 min  SIGNED:   Marylu Lund, MD  [facility] 03/09/2018, 3:03 PM  If 7PM-7AM, please contact night-coverage

## 2018-03-09 NOTE — Evaluation (Signed)
Physical Therapy Evaluation Patient Details Name: Johnny Durham MRN: [id number] DOB: [birth year] Today's Date: 03/09/2018   History of Present Illness  73 y.o. male with history of chronic systolic heart failure last EF was around 25 to 30% per care everywhere, diabetes mellitus type 2, CAD, cirrhosis who has had undergone paracentesis at least 3 times recently last one was actually yesterday at [facility] had come to the ER at [facility] for shortness of breath not feeling well for the last 1 week.  Has been exam productive cough.  Also had some nausea vomiting and noticed some blood-tinged vomitus.  During evaluation he was found to have multifocal pneumonia with possible GI bleed and was transferred to [facility] for higher level of care.  Clinical Impression  Pt with some general weakness however improved steadiness even during session. Walked without AD first and needed HHA and minA , however with RW able to increase in speed and balance. Educated pt and pt's son to encourage use of RW for a few days to increase strength and stability with ambulation. Pt feels he will progress himself with mobility and did not want HHPT at this time. Pt to DC today and we discussed safety with mobility and general progression.     Follow Up Recommendations No PT follow up(pt states he wants to progress himself )    Equipment Recommendations  None recommended by PT(recommended  a RW and pt states he has one)    Recommendations for Other Services       Precautions / Restrictions Precautions Precautions: None Restrictions Weight Bearing Restrictions: No      Mobility  Bed Mobility Overal bed mobility: Independent                Transfers Overall transfer level: Needs assistance Equipment used: Rolling walker (2 wheeled);None Transfers: Sit to/from Stand Sit to Stand: Supervision            Ambulation/Gait Ambulation/Gait assistance: Min  assist;Supervision(supervision when we trialed the RW and minA for hand held assit with no AD. ) Gait Distance (Feet): 120 Feet(twice ) Assistive device: Rolling walker (2 wheeled) Gait Pattern/deviations: Step-through pattern        Stairs            Wheelchair Mobility    Modified Rankin (Stroke Patients Only)       Balance Overall balance assessment: Modified Independent                                           Pertinent Vitals/Pain Pain Assessment: No/denies pain    Home Living Family/patient expects to be discharged to:: Private residence Living Arrangements: Spouse/significant other Available Help at Discharge: Family Type of Home: House Home Access: Stairs to enter   Technical brewer of Steps: 1 Home Layout: One level Home Equipment: Cane - single point;Walker - 2 wheels      Prior Function Level of Independence: Independent         Comments: occassionaly would use a cane for stability .      Hand Dominance        Extremity/Trunk Assessment        Lower Extremity Assessment Lower Extremity Assessment: Overall WFL for tasks assessed       Communication   Communication: No difficulties  Cognition Arousal/Alertness: Awake/alert Behavior During Therapy: WFL for tasks assessed/performed Overall Cognitive Status:  Within Functional Limits for tasks assessed                                        General Comments      Exercises     Assessment/Plan    PT Assessment Patent does not need any further PT services(pt discharging home today )  PT Problem List         PT Treatment Interventions      PT Goals (Current goals can be found in the Care Plan section)  Acute Rehab PT Goals PT Goal Formulation: All assessment and education complete, DC therapy    Frequency     Barriers to discharge        Co-evaluation               AM-PAC PT "6 Clicks" Mobility  Outcome Measure Help  needed turning from your back to your side while in a flat bed without using bedrails?: None Help needed moving from lying on your back to sitting on the side of a flat bed without using bedrails?: None Help needed moving to and from a bed to a chair (including a wheelchair)?: A Little Help needed standing up from a chair using your arms (e.g., wheelchair or bedside chair)?: A Little Help needed to walk in hospital room?: A Little Help needed climbing 3-5 steps with a railing? : A Little 6 Click Score: 20    End of Session Equipment Utilized During Treatment: Gait belt Activity Tolerance: Patient tolerated treatment well Patient left: in chair;with family/visitor present Nurse Communication: Mobility status PT Visit Diagnosis: Muscle weakness (generalized) (M62.81)    Time: 1400-1445 PT Time Calculation (min) (ACUTE ONLY): 45 min   Charges:   PT Evaluation $PT Eval Low Complexity: 1 Low PT Treatments $Gait Training: 8-22 mins $Therapeutic Activity: 8-22 mins        Clide Dales, PT Acute Rehabilitation Services Pager: [phone] Office: [phone] 03/09/2018   Clide Dales 03/09/2018, 7:03 PM

## 2018-03-10 LAB — GLUCOSE, CAPILLARY: GLUCOSE-CAPILLARY: 84 mg/dL (ref 70–99)

## 2018-03-19 DIAGNOSIS — I110 Hypertensive heart disease with heart failure: Secondary | ICD-10-CM | POA: Diagnosis not present

## 2018-03-19 DIAGNOSIS — Z9189 Other specified personal risk factors, not elsewhere classified: Secondary | ICD-10-CM | POA: Diagnosis not present

## 2018-03-19 DIAGNOSIS — I5022 Chronic systolic (congestive) heart failure: Secondary | ICD-10-CM | POA: Diagnosis not present

## 2018-03-19 DIAGNOSIS — K7460 Unspecified cirrhosis of liver: Secondary | ICD-10-CM | POA: Diagnosis not present

## 2018-04-10 DIAGNOSIS — K7469 Other cirrhosis of liver: Secondary | ICD-10-CM | POA: Diagnosis not present

## 2018-04-10 DIAGNOSIS — R188 Other ascites: Secondary | ICD-10-CM | POA: Diagnosis not present

## 2018-04-10 DIAGNOSIS — Z86010 Personal history of colonic polyps: Secondary | ICD-10-CM | POA: Diagnosis not present

## 2018-04-10 DIAGNOSIS — I4891 Unspecified atrial fibrillation: Secondary | ICD-10-CM | POA: Diagnosis not present

## 2018-04-10 DIAGNOSIS — R778 Other specified abnormalities of plasma proteins: Secondary | ICD-10-CM | POA: Diagnosis not present

## 2018-04-10 DIAGNOSIS — R0602 Shortness of breath: Secondary | ICD-10-CM | POA: Diagnosis not present

## 2018-04-10 DIAGNOSIS — I951 Orthostatic hypotension: Secondary | ICD-10-CM | POA: Diagnosis not present

## 2018-04-10 DIAGNOSIS — R748 Abnormal levels of other serum enzymes: Secondary | ICD-10-CM | POA: Diagnosis not present

## 2018-04-10 DIAGNOSIS — K7460 Unspecified cirrhosis of liver: Secondary | ICD-10-CM | POA: Diagnosis not present

## 2018-04-10 DIAGNOSIS — I252 Old myocardial infarction: Secondary | ICD-10-CM | POA: Diagnosis not present

## 2018-06-13 DIAGNOSIS — R188 Other ascites: Secondary | ICD-10-CM | POA: Diagnosis not present

## 2018-06-13 DIAGNOSIS — K7460 Unspecified cirrhosis of liver: Secondary | ICD-10-CM | POA: Diagnosis not present

## 2018-07-10 DIAGNOSIS — R188 Other ascites: Secondary | ICD-10-CM | POA: Diagnosis not present

## 2018-07-10 DIAGNOSIS — Z86010 Personal history of colonic polyps: Secondary | ICD-10-CM | POA: Diagnosis not present

## 2018-07-10 DIAGNOSIS — I5020 Unspecified systolic (congestive) heart failure: Secondary | ICD-10-CM | POA: Diagnosis not present

## 2018-07-10 DIAGNOSIS — K7469 Other cirrhosis of liver: Secondary | ICD-10-CM | POA: Diagnosis not present

## 2018-07-29 DIAGNOSIS — R188 Other ascites: Secondary | ICD-10-CM | POA: Diagnosis not present

## 2018-08-28 DIAGNOSIS — R188 Other ascites: Secondary | ICD-10-CM | POA: Diagnosis present

## 2018-08-28 DIAGNOSIS — R001 Bradycardia, unspecified: Secondary | ICD-10-CM | POA: Diagnosis not present

## 2018-08-28 DIAGNOSIS — I69391 Dysphagia following cerebral infarction: Secondary | ICD-10-CM | POA: Diagnosis not present

## 2018-08-28 DIAGNOSIS — N179 Acute kidney failure, unspecified: Secondary | ICD-10-CM | POA: Diagnosis present

## 2018-08-28 DIAGNOSIS — R4020 Unspecified coma: Secondary | ICD-10-CM | POA: Diagnosis not present

## 2018-08-28 DIAGNOSIS — Z436 Encounter for attention to other artificial openings of urinary tract: Secondary | ICD-10-CM | POA: Diagnosis not present

## 2018-08-28 DIAGNOSIS — I4891 Unspecified atrial fibrillation: Secondary | ICD-10-CM | POA: Diagnosis not present

## 2018-08-28 DIAGNOSIS — I252 Old myocardial infarction: Secondary | ICD-10-CM | POA: Diagnosis not present

## 2018-08-28 DIAGNOSIS — R0902 Hypoxemia: Secondary | ICD-10-CM | POA: Diagnosis not present

## 2018-08-28 DIAGNOSIS — G9341 Metabolic encephalopathy: Secondary | ICD-10-CM | POA: Diagnosis present

## 2018-08-28 DIAGNOSIS — J90 Pleural effusion, not elsewhere classified: Secondary | ICD-10-CM | POA: Diagnosis not present

## 2018-08-28 DIAGNOSIS — R4182 Altered mental status, unspecified: Secondary | ICD-10-CM | POA: Diagnosis not present

## 2018-08-28 DIAGNOSIS — I2510 Atherosclerotic heart disease of native coronary artery without angina pectoris: Secondary | ICD-10-CM | POA: Diagnosis not present

## 2018-08-28 DIAGNOSIS — E7800 Pure hypercholesterolemia, unspecified: Secondary | ICD-10-CM | POA: Diagnosis not present

## 2018-08-28 DIAGNOSIS — R404 Transient alteration of awareness: Secondary | ICD-10-CM | POA: Diagnosis not present

## 2018-08-28 DIAGNOSIS — D649 Anemia, unspecified: Secondary | ICD-10-CM | POA: Diagnosis not present

## 2018-08-28 DIAGNOSIS — Z7401 Bed confinement status: Secondary | ICD-10-CM | POA: Diagnosis not present

## 2018-08-28 DIAGNOSIS — E785 Hyperlipidemia, unspecified: Secondary | ICD-10-CM | POA: Diagnosis present

## 2018-08-28 DIAGNOSIS — R29810 Facial weakness: Secondary | ICD-10-CM | POA: Diagnosis not present

## 2018-08-28 DIAGNOSIS — E119 Type 2 diabetes mellitus without complications: Secondary | ICD-10-CM | POA: Diagnosis not present

## 2018-08-28 DIAGNOSIS — Z66 Do not resuscitate: Secondary | ICD-10-CM | POA: Diagnosis present

## 2018-08-28 DIAGNOSIS — I69320 Aphasia following cerebral infarction: Secondary | ICD-10-CM | POA: Diagnosis not present

## 2018-08-28 DIAGNOSIS — I10 Essential (primary) hypertension: Secondary | ICD-10-CM | POA: Diagnosis present

## 2018-08-28 DIAGNOSIS — Z79899 Other long term (current) drug therapy: Secondary | ICD-10-CM | POA: Diagnosis not present

## 2018-08-28 DIAGNOSIS — Z8673 Personal history of transient ischemic attack (TIA), and cerebral infarction without residual deficits: Secondary | ICD-10-CM | POA: Diagnosis not present

## 2018-08-28 DIAGNOSIS — Z515 Encounter for palliative care: Secondary | ICD-10-CM | POA: Diagnosis not present

## 2018-08-28 DIAGNOSIS — J690 Pneumonitis due to inhalation of food and vomit: Secondary | ICD-10-CM | POA: Diagnosis present

## 2018-08-28 DIAGNOSIS — R0689 Other abnormalities of breathing: Secondary | ICD-10-CM | POA: Diagnosis not present

## 2018-08-28 DIAGNOSIS — K7460 Unspecified cirrhosis of liver: Secondary | ICD-10-CM | POA: Diagnosis present

## 2018-08-28 DIAGNOSIS — E875 Hyperkalemia: Secondary | ICD-10-CM | POA: Diagnosis not present

## 2018-08-28 DIAGNOSIS — I959 Hypotension, unspecified: Secondary | ICD-10-CM | POA: Diagnosis present

## 2018-08-28 DIAGNOSIS — Z87891 Personal history of nicotine dependence: Secondary | ICD-10-CM | POA: Diagnosis not present

## 2018-08-28 DIAGNOSIS — M2550 Pain in unspecified joint: Secondary | ICD-10-CM | POA: Diagnosis not present

## 2018-08-28 DIAGNOSIS — Z7984 Long term (current) use of oral hypoglycemic drugs: Secondary | ICD-10-CM | POA: Diagnosis not present

## 2018-08-28 DIAGNOSIS — I639 Cerebral infarction, unspecified: Secondary | ICD-10-CM | POA: Diagnosis present

## 2018-09-03 ENCOUNTER — Other Ambulatory Visit: Payer: Self-pay

## 2018-09-06 DEATH — deceased
